# Patient Record
Sex: Female | Born: 1980 | Race: Black or African American | Hispanic: No | Marital: Single | Smoking: Former smoker
Health system: Southern US, Community
[De-identification: ages and names within clinical notes are randomized; demographics above are authoritative.]

## PROBLEM LIST (undated history)

## (undated) DIAGNOSIS — I10 Essential (primary) hypertension: Secondary | ICD-10-CM

## (undated) HISTORY — PX: FEMUR SURGERY: SHX943

## (undated) HISTORY — PX: FRACTURE SURGERY: SHX138

---

## 2010-05-17 ENCOUNTER — Emergency Department (HOSPITAL_COMMUNITY): Admission: EM | Admit: 2010-05-17 | Discharge: 2010-05-17 | Payer: Self-pay | Admitting: Emergency Medicine

## 2011-10-15 ENCOUNTER — Emergency Department (HOSPITAL_COMMUNITY)
Admission: EM | Admit: 2011-10-15 | Discharge: 2011-10-15 | Disposition: A | Discharge status: Home / Self Care | Payer: BC Managed Care – PPO | Attending: Emergency Medicine | Admitting: Emergency Medicine

## 2011-10-15 ENCOUNTER — Encounter: Payer: Self-pay | Admitting: *Deleted

## 2011-10-15 DIAGNOSIS — F17200 Nicotine dependence, unspecified, uncomplicated: Secondary | ICD-10-CM | POA: Insufficient documentation

## 2011-10-15 DIAGNOSIS — N751 Abscess of Bartholin's gland: Secondary | ICD-10-CM | POA: Insufficient documentation

## 2011-10-15 MED ORDER — HYDROCODONE-ACETAMINOPHEN 5-500 MG PO TABS: 1.0000 | ORAL_TABLET | Freq: Four times a day (QID) | ORAL | Status: AC | PRN

## 2011-10-15 MED ORDER — LIDOCAINE-EPINEPHRINE 2 %-1:100000 IJ SOLN
20.0000 mL | Freq: Once | INTRAMUSCULAR | Status: AC
  Administered 2011-10-15: 1 mL
  Filled 2011-10-15: qty 1

## 2011-10-15 MED ORDER — OXYCODONE-ACETAMINOPHEN 5-325 MG PO TABS
2.0000 | ORAL_TABLET | Freq: Once | ORAL | Status: AC
  Administered 2011-10-15: 2 via ORAL
  Filled 2011-10-15: qty 2

## 2011-10-15 NOTE — ED Notes (Signed)
Pt states she has knot on her labia. Pt states it has become painful for her to walk or wear jeans. Pt denies any discharge or odor

## 2011-10-15 NOTE — ED Provider Notes (Signed)
History     CSN: [id number] Arrival date & time: 10/15/2011  9:46 AM   First MD Initiated Contact with Patient 10/15/11 1007      Chief Complaint  Patient presents with  . Vaginal Pain    (Consider location/radiation/quality/duration/timing/severity/associated sxs/prior treatment) HPI  Patient presents to emergency department with a few day history of left labial swelling with increasing pain with the increasing labial swelling. Patient denies history of similar events. Patient has taken over-the-counter pain medicine without relief of pain. Patient states pain is aggravated by walking, sitting, and direct pressure to labial region. Patient denies vaginal discharge, fevers, chills, abdominal pain, dysuria, hematuria. Patient states she does have an OB/GYN in the [facility]. Symptoms are gradual onset, persistent, and worsening.  History reviewed. No pertinent past medical history.  Past Surgical History  Procedure Date  . Femur surgery     History reviewed. No pertinent family history.  History  Substance Use Topics  . Smoking status: Current Everyday Smoker  . Smokeless tobacco: Not on file  . Alcohol Use: No    OB History    Grav Para Term Preterm Abortions TAB SAB Ect Mult Living                  Review of Systems  All other systems reviewed and are negative.    Allergies  Review of patient's allergies indicates no known allergies.  Home Medications  No current outpatient prescriptions on file.  BP 114/83  Pulse 94  Temp(Src) 98.7 F (37.1 C) (Oral)  Resp 16  Ht 5\' 7"  (1.702 m)  Wt 115 lb (52.164 kg)  BMI 18.01 kg/m2  SpO2 100%  LMP 09/27/2011  Physical Exam  Nursing note and vitals reviewed. Constitutional: She is oriented to person, place, and time. She appears well-developed and well-nourished.  HENT:  Head: Normocephalic and atraumatic.  Eyes: Conjunctivae are normal.  Neck: Normal range of motion. Neck supple.  Cardiovascular: Normal rate.   Exam reveals no gallop and no friction rub.   No murmur heard. Pulmonary/Chest: Effort normal and breath sounds normal. No respiratory distress. She has no wheezes. She has no rales. She exhibits no tenderness.  Abdominal: Soft. Bowel sounds are normal. She exhibits no distension and no mass. There is no tenderness. There is no rebound and no guarding.  Genitourinary: Vagina normal.    There is tenderness on the left labia.       2-3 cm round fluctuant swelling of left lower radial regions with tenderness to palpation consistent with swollen Bartholin's gland.  Musculoskeletal: Normal range of motion.  Neurological: She is alert and oriented to person, place, and time.  Skin: Skin is warm and dry.    ED Course  INCISION AND DRAINAGE Performed by: Jenness Corner Authorized by: Jenness Corner Consent: Verbal consent obtained. Risks and benefits: risks, benefits and alternatives were discussed Consent given by: patient Patient understanding: patient states understanding of the procedure being performed Patient consent: the patient's understanding of the procedure matches consent given Patient identity confirmed: verbally with patient and arm band Type: abscess Location: left lower labia/barholin's gland. Local anesthetic: lidocaine 2% with epinephrine Anesthetic total: 8 ml Scalpel size: 10 Needle gauge: 22 Incision type: single straight Complexity: complex Drainage: purulent Drainage amount: copious Wound treatment: ward's catheter placed. Patient tolerance: Patient tolerated the procedure well with no immediate complications.   (including critical care time)  By mouth Percocet  Labs Reviewed - No data to display No results found.  1. Bartholin's gland abscess       MDM  Patient tolerated incision and drainage of Bartholin gland well without complication or difficulty. Warts catheter placed. Abdomen soft nontender patient afebrile. Patient has close follow up with  her OB/GYN for recheck at the beginning of the week.        Jenness Corner, [place] 10/15/11 1530

## 2011-10-16 NOTE — ED Provider Notes (Signed)
Evaluation and management procedures were performed by the mid-level provider (PA/NP/CNM) under my supervision/collaboration. I was present and available during the ED course. Denise Cregan Y.   Gavin Potter. Oletta Lamas, MD 10/16/11 6213

## 2014-07-21 ENCOUNTER — Emergency Department (HOSPITAL_BASED_OUTPATIENT_CLINIC_OR_DEPARTMENT_OTHER)
Admission: EM | Admit: 2014-07-21 | Discharge: 2014-07-21 | Disposition: A | Discharge status: Home / Self Care | Payer: BC Managed Care – PPO | Attending: Emergency Medicine | Admitting: Emergency Medicine

## 2014-07-21 ENCOUNTER — Encounter (HOSPITAL_BASED_OUTPATIENT_CLINIC_OR_DEPARTMENT_OTHER): Payer: Self-pay | Admitting: Emergency Medicine

## 2014-07-21 DIAGNOSIS — Z3202 Encounter for pregnancy test, result negative: Secondary | ICD-10-CM | POA: Insufficient documentation

## 2014-07-21 DIAGNOSIS — F17200 Nicotine dependence, unspecified, uncomplicated: Secondary | ICD-10-CM | POA: Insufficient documentation

## 2014-07-21 DIAGNOSIS — K219 Gastro-esophageal reflux disease without esophagitis: Secondary | ICD-10-CM

## 2014-07-21 DIAGNOSIS — R1031 Right lower quadrant pain: Secondary | ICD-10-CM | POA: Insufficient documentation

## 2014-07-21 DIAGNOSIS — N949 Unspecified condition associated with female genital organs and menstrual cycle: Secondary | ICD-10-CM | POA: Insufficient documentation

## 2014-07-21 LAB — CBC WITH DIFFERENTIAL/PLATELET
BASOS ABS: 0 10*3/uL (ref 0.0–0.1)
Basophils Relative: 0 % (ref 0–1)
Eosinophils Absolute: 0.1 10*3/uL (ref 0.0–0.7)
Eosinophils Relative: 2 % (ref 0–5)
HCT: 36 % (ref 36.0–46.0)
Hemoglobin: 11.8 g/dL — ABNORMAL LOW (ref 12.0–15.0)
LYMPHS PCT: 28 % (ref 12–46)
Lymphs Abs: 2.2 10*3/uL (ref 0.7–4.0)
MCH: 32.7 pg (ref 26.0–34.0)
MCHC: 32.8 g/dL (ref 30.0–36.0)
MCV: 99.7 fL (ref 78.0–100.0)
Monocytes Absolute: 0.8 10*3/uL (ref 0.1–1.0)
Monocytes Relative: 10 % (ref 3–12)
NEUTROS ABS: 4.7 10*3/uL (ref 1.7–7.7)
Neutrophils Relative %: 60 % (ref 43–77)
PLATELETS: 208 10*3/uL (ref 150–400)
RBC: 3.61 MIL/uL — ABNORMAL LOW (ref 3.87–5.11)
RDW: 11.5 % (ref 11.5–15.5)
WBC: 7.8 10*3/uL (ref 4.0–10.5)

## 2014-07-21 LAB — URINALYSIS, ROUTINE W REFLEX MICROSCOPIC
Bilirubin Urine: NEGATIVE
Glucose, UA: NEGATIVE mg/dL
HGB URINE DIPSTICK: NEGATIVE
Ketones, ur: NEGATIVE mg/dL
Leukocytes, UA: NEGATIVE
NITRITE: NEGATIVE
PH: 7.5 (ref 5.0–8.0)
Protein, ur: NEGATIVE mg/dL
SPECIFIC GRAVITY, URINE: 1.024 (ref 1.005–1.030)
UROBILINOGEN UA: 1 mg/dL (ref 0.0–1.0)

## 2014-07-21 LAB — COMPREHENSIVE METABOLIC PANEL
ALT: 6 U/L (ref 0–35)
AST: 17 U/L (ref 0–37)
Albumin: 3.8 g/dL (ref 3.5–5.2)
Alkaline Phosphatase: 35 U/L — ABNORMAL LOW (ref 39–117)
Anion gap: 13 (ref 5–15)
BUN: 14 mg/dL (ref 6–23)
CHLORIDE: 103 meq/L (ref 96–112)
CO2: 24 meq/L (ref 19–32)
Calcium: 9.2 mg/dL (ref 8.4–10.5)
Creatinine, Ser: 0.6 mg/dL (ref 0.50–1.10)
GFR calc Af Amer: >90 mL/min (ref >90)
Glucose, Bld: 92 mg/dL (ref 70–99)
POTASSIUM: 3.9 meq/L (ref 3.7–5.3)
SODIUM: 140 meq/L (ref 137–147)
Total Protein: 6.9 g/dL (ref 6.0–8.3)

## 2014-07-21 LAB — PREGNANCY, URINE: PREG TEST UR: NEGATIVE

## 2014-07-21 LAB — LIPASE, BLOOD: Lipase: 58 U/L (ref 11–59)

## 2014-07-21 MED ORDER — GI COCKTAIL ~~LOC~~
30.0000 mL | Freq: Once | ORAL | Status: AC
  Administered 2014-07-21: 30 mL via ORAL
  Filled 2014-07-21: qty 30

## 2014-07-21 MED ORDER — ONDANSETRON 8 MG PO TBDP
8.0000 mg | ORAL_TABLET | Freq: Once | ORAL | Status: AC
  Administered 2014-07-21: 8 mg via ORAL
  Filled 2014-07-21: qty 1

## 2014-07-21 NOTE — Discharge Instructions (Signed)
Take Pepci AC, Zanta,, or Prilosec OTC (store brands work just as well as name brands) for 4-8 weeks.  Gastroesophageal Reflux Disease, Adult Gastroesophageal reflux disease (GERD) happens when acid from your stomach flows up into the esophagus. When acid comes in contact with the esophagus, the acid causes soreness (inflammation) in the esophagus. Over time, GERD may create small holes (ulcers) in the lining of the esophagus. CAUSES   Increased body weight. This puts pressure on the stomach, making acid rise from the stomach into the esophagus.  Smoking. This increases acid production in the stomach.  Drinking alcohol. This causes decreased pressure in the lower esophageal sphincter (valve or ring of muscle between the esophagus and stomach), allowing acid from the stomach into the esophagus.  Late evening meals and a full stomach. This increases pressure and acid production in the stomach.  A malformed lower esophageal sphincter. Sometimes, no cause is found. SYMPTOMS   Burning pain in the lower part of the mid-chest behind the breastbone and in the mid-stomach area. This may occur twice a week or more often.  Trouble swallowing.  Sore throat.  Dry cough.  Asthma-like symptoms including chest tightness, shortness of breath, or wheezing. DIAGNOSIS  Your caregiver may be able to diagnose GERD based on your symptoms. In some cases, X-rays and other tests may be done to check for complications or to check the condition of your stomach and esophagus. TREATMENT  Your caregiver may recommend over-the-counter or prescription medicines to help decrease acid production. Ask your caregiver before starting or adding any new medicines.  HOME CARE INSTRUCTIONS   Change the factors that you can control. Ask your caregiver for guidance concerning weight loss, quitting smoking, and alcohol consumption.  Avoid foods and drinks that make your symptoms worse, such as:  Caffeine or alcoholic  drinks.  Chocolate.  Peppermint or mint flavorings.  Garlic and onions.  Spicy foods.  Citrus fruits, such as oranges, lemons, or limes.  Tomato-based foods such as sauce, chili, salsa, and pizza.  Fried and fatty foods.  Avoid lying down for the 3 hours prior to your bedtime or prior to taking a nap.  Eat small, frequent meals instead of large meals.  Wear loose-fitting clothing. Do not wear anything tight around your waist that causes pressure on your stomach.  Raise the head of your bed 6 to 8 inches with wood blocks to help you sleep. Extra pillows will not help.  Only take over-the-counter or prescription medicines for pain, discomfort, or fever as directed by your caregiver.  Do not take aspirin, ibuprofen, or other nonsteroidal anti-inflammatory drugs (NSAIDs). SEEK IMMEDIATE MEDICAL CARE IF:   You have pain in your arms, neck, jaw, teeth, or back.  Your pain increases or changes in intensity or duration.  You develop nausea, vomiting, or sweating (diaphoresis).  You develop shortness of breath, or you faint.  Your vomit is green, yellow, black, or looks like coffee grounds or blood.  Your stool is red, bloody, or black. These symptoms could be signs of other problems, such as heart disease, gastric bleeding, or esophageal bleeding. MAKE SURE YOU:   Understand these instructions.  Will watch your condition.  Will get help right away if you are not doing well or get worse. Document Released: 08/18/2005 Document Revised: 01/31/2012 Document Reviewed: 05/28/2011 [facility] Patient Information 2015 [place], [place]. This information is not intended to replace advice given to you by your health care provider. Make sure you discuss any questions you have with  your health care provider. ° °

## 2014-07-21 NOTE — ED Provider Notes (Signed)
CSN: [id number]     Arrival date & time 07/21/14  0136 History   First MD Initiated Contact with Patient 07/21/14 [phone]     Chief Complaint  Patient presents with  . Pelvic Pain     (Consider location/radiation/quality/duration/timing/severity/associated sxs/prior Treatment) Patient is a 33 y.o. female presenting with pelvic pain. The history is provided by the patient.  Pelvic Pain  She has been having intermittent right lower abdominal pain with radiation to the right flank. Pain is intermittent it has been going on for last 2 weeks. Pain will last about 10 minutes before resolving. She describes it as still in therapy and I rates her pain at 9/10 when present. When present, nothing seems to make it worse. It is better with massage. She's been taking ibuprofen with some temporary relief. There is no associated nausea or vomiting. She denies fever or chills. There has been no dysuria or urinary urgency or frequency. Last menses was one week ago and was lighter than normal. There's been no vaginal discharge or bleeding. She's not using any contraception.  History reviewed. No pertinent past medical history. Past Surgical History  Procedure Laterality Date  . Femur surgery     History reviewed. No pertinent family history. History  Substance Use Topics  . Smoking status: Current Every Day Smoker  . Smokeless tobacco: Not on file  . Alcohol Use: No   OB History   Grav Para Term Preterm Abortions TAB SAB Ect Mult Living                 Review of Systems  Genitourinary: Positive for pelvic pain.  All other systems reviewed and are negative.     Allergies  Review of patient's allergies indicates no known allergies.  Home Medications   Prior to Admission medications   Medication Sig Start Date End Date Taking? Authorizing Provider  HYDROcodone-acetaminophen (VICODIN) 5-500 MG per tablet Take 1 tablet by mouth every 6 (six) hours as needed.      Historical Provider, MD   ibuprofen (ADVIL,MOTRIN) 200 MG tablet Take 400 mg by mouth every 6 (six) hours as needed.      Historical Provider, MD   BP 171/123  Pulse 69  Temp(Src) 98 F (36.7 C) (Oral)  Resp 20  Ht  (1.702 m)  Wt 118 lb (53.524 kg)  BMI 18.48 kg/m2  SpO2 100%  LMP 07/14/2014 Physical Exam  Nursing note and vitals reviewed.  33 year old female, resting comfortably and in no acute distress. Vital signs are significant for hypertension. Oxygen saturation is 100%, which is normal. Head is normocephalic and atraumatic. PERRLA, EOMI. Oropharynx is clear. Neck is nontender and supple without adenopathy or JVD. Back is nontender and there is no CVA tenderness. Lungs are clear without rales, wheezes, or rhonchi. Chest is nontender. Heart has regular rate and rhythm without murmur. Abdomen is soft, flat, with mild epigastric tenderness. There is no rebound or guarding. There are no masses or hepatosplenomegaly and peristalsis is normoactive. Extremities have no cyanosis or edema, full range of motion is present. Skin is warm and dry without rash. Neurologic: Mental status is normal, cranial nerves are intact, there are no motor or sensory deficits.  ED Course  Procedures (including critical care time) Labs Review Results for orders placed during the hospital encounter of 07/21/14  URINALYSIS, ROUTINE W REFLEX MICROSCOPIC      Result Value Ref Range   Color, Urine YELLOW  YELLOW   APPearance CLOUDY (*) CLEAR  Specific Gravity, Urine 1.024  1.005 - 1.030   pH 7.5  5.0 - 8.0   Glucose, UA NEGATIVE  NEGATIVE mg/dL   Hgb urine dipstick NEGATIVE  NEGATIVE   Bilirubin Urine NEGATIVE  NEGATIVE   Ketones, ur NEGATIVE  NEGATIVE mg/dL   Protein, ur NEGATIVE  NEGATIVE mg/dL   Urobilinogen, UA 1.0  0.0 - 1.0 mg/dL   Nitrite NEGATIVE  NEGATIVE   Leukocytes, UA NEGATIVE  NEGATIVE  PREGNANCY, URINE      Result Value Ref Range   Preg Test, Ur NEGATIVE  NEGATIVE  CBC WITH DIFFERENTIAL      Result  Value Ref Range   WBC 7.8  4.0 - 10.5 K/uL   RBC 3.61 (*) 3.87 - 5.11 MIL/uL   Hemoglobin 11.8 (*) 12.0 - 15.0 g/dL   HCT 69.6  29.5 - 28.4 %   MCV 99.7  78.0 - 100.0 fL   MCH 32.7  26.0 - 34.0 pg   MCHC 32.8  30.0 - 36.0 g/dL   RDW 13.2  44.0 - 10.2 %   Platelets 208  150 - 400 K/uL   Neutrophils Relative % 60  43 - 77 %   Neutro Abs 4.7  1.7 - 7.7 K/uL   Lymphocytes Relative 28  12 - 46 %   Lymphs Abs 2.2  0.7 - 4.0 K/uL   Monocytes Relative 10  3 - 12 %   Monocytes Absolute 0.8  0.1 - 1.0 K/uL   Eosinophils Relative 2  0 - 5 %   Eosinophils Absolute 0.1  0.0 - 0.7 K/uL   Basophils Relative 0  0 - 1 %   Basophils Absolute 0.0  0.0 - 0.1 K/uL  COMPREHENSIVE METABOLIC PANEL      Result Value Ref Range   Sodium 140  137 - 147 mEq/L   Potassium 3.9  3.7 - 5.3 mEq/L   Chloride 103  96 - 112 mEq/L   CO2 24  19 - 32 mEq/L   Glucose, Bld 92  70 - 99 mg/dL   BUN 14  6 - 23 mg/dL   Creatinine, Ser 7.25  0.50 - 1.10 mg/dL   Calcium 9.2  8.4 - 36.6 mg/dL   Total Protein 6.9  6.0 - 8.3 g/dL   Albumin 3.8  3.5 - 5.2 g/dL   AST 17  0 - 37 U/L   ALT 6  0 - 35 U/L   Alkaline Phosphatase 35 (*) 39 - 117 U/L   Total Bilirubin <0.2 (*) 0.3 - 1.2 mg/dL   GFR calc non Af Amer >90  >90 mL/min   GFR calc Af Amer >90  >90 mL/min   Anion gap 13  5 - 15  LIPASE, BLOOD      Result Value Ref Range   Lipase 58  11 - 59 U/L    MDM   Final diagnoses:  Gastroesophageal reflux disease without esophagitis    Abdominal pain of uncertain cause. There is a disconnect with her complaint of pain being in the right lower abdomen and exam showing tenderness in the epigastric area. She will be given a therapeutic of a GI cocktail. Blood pressure will need to be repeated. Old records are reviewed and there are no relevant past visits.  She had excellent relief of symptoms with GI cocktail. Laboratory workup was unremarkable. Repeat blood pressure is still elevated and she is advised to have it rechecked as  an outpatient. Blood pressure is persistently elevated, she  may need to be started on medication to treat her blood pressure. She is advised to use over-the-counter H2 blockers or proton pump inhibitors for the next 4-8 weeks and then to use them as needed.  Dione Booze, MD 07/21/14 [phone]

## 2014-07-21 NOTE — ED Notes (Signed)
Patient reports that she is having right lower pelvic pain to flank pain x 2 weeks. The patient reports vomiting a couple of days ago. Denies any today. Patient is having loose stools. The patient reports that she is having a crampling feeling to here stomach.

## 2014-07-21 NOTE — ED Notes (Signed)
I took initial BP on right arm got 179/131, then changed to smaller cuff and got result of 179/123, so finally changed arms and used smaller cuff with results of 171/123, then notified nurse of same.

## 2014-11-04 ENCOUNTER — Encounter (HOSPITAL_BASED_OUTPATIENT_CLINIC_OR_DEPARTMENT_OTHER): Payer: Self-pay | Admitting: *Deleted

## 2014-11-04 ENCOUNTER — Emergency Department (HOSPITAL_BASED_OUTPATIENT_CLINIC_OR_DEPARTMENT_OTHER)
Admission: EM | Admit: 2014-11-04 | Discharge: 2014-11-04 | Disposition: A | Discharge status: Home / Self Care | Payer: BC Managed Care – PPO | Attending: Emergency Medicine | Admitting: Emergency Medicine

## 2014-11-04 DIAGNOSIS — Z79899 Other long term (current) drug therapy: Secondary | ICD-10-CM | POA: Insufficient documentation

## 2014-11-04 DIAGNOSIS — I10 Essential (primary) hypertension: Secondary | ICD-10-CM

## 2014-11-04 HISTORY — DX: Essential (primary) hypertension: I10

## 2014-11-04 MED ORDER — HYDROCHLOROTHIAZIDE 25 MG PO TABS: 25.0000 mg | ORAL_TABLET | Freq: Every day | ORAL | Status: DC

## 2014-11-04 NOTE — ED Provider Notes (Signed)
CSN: [id number]     Arrival date & time 11/04/14  1950 History  This chart was scribed for Doug SouSam Donasia Wimes, MD by Abel PrestoKara Demonbreun, ED Scribe. This patient was seen in room MH02/MH02 and the patient's care was started at 9:43 PM.    Chief Complaint  Patient presents with  . Hypertension    Patient is a 33 y.o. female presenting with hypertension. The history is provided by the patient. No language interpreter was used.  Hypertension Pertinent negatives include no chest pain and no shortness of breath.    HPI Comments: Denise Potter is a 33 y.o. female who presents to the Emergency Department complaining of hypertension. 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  Pt notes fatigue and intermittent pressure in her neck with onset a month ago. None presently. Patient presently asymptomatic Pt notes getting upset or angry makes the pressure worse. Pt states she has has another instance of elevated BP during a prior unrelated ED visit. She notes she was not prescribed medication for HTN at that time.  Pt smoked until two months ago, with EtOH use until 3 months ago, and denies drug abuse. Pt is currently on her period and denies chance of pregnancy. Pt denies dyspnea and chest pain.   Past Medical History  Diagnosis Date  . Hypertension    Past Surgical History  Procedure Laterality Date  . Femur surgery     History reviewed. No pertinent family history. History  Substance Use Topics  . Smoking status: Former Games developermoker  . Smokeless tobacco: Not on file  . Alcohol Use: No   OB History    No data available     Review of Systems  Constitutional: Negative.   HENT: Negative.   Respiratory: Negative.  Negative for shortness of breath.   Cardiovascular: Negative.  Negative for chest pain.  Gastrointestinal: Negative.   Musculoskeletal: Negative.   Skin: Negative.   Neurological: Negative.   Psychiatric/Behavioral: Negative.   All other systems reviewed  and are negative.     Allergies  Review of patient's allergies indicates no known allergies.  Home Medications   Prior to Admission medications   Medication Sig Start Date End Date Taking? Authorizing Provider  HYDROcodone-acetaminophen (VICODIN) 5-500 MG per tablet Take 1 tablet by mouth every 6 (six) hours as needed.      Historical Provider, MD  ibuprofen (ADVIL,MOTRIN) 200 MG tablet Take 400 mg by mouth every 6 (six) hours as needed.      Historical Provider, MD   BP 158/117 mmHg  Pulse 64  Temp(Src) 98.4 F (36.9 C) (Oral)  Resp 20  Ht 5\' 7"  (1.702 m)  Wt 120 lb (54.432 kg)  BMI 18.79 kg/m2  SpO2 100%  LMP 11/04/2014 Physical Exam  Constitutional: She appears well-developed and well-nourished.  HENT:  Head: Normocephalic and atraumatic.  Eyes: Conjunctivae are normal. Pupils are equal, round, and reactive to light.  Neck: Neck supple. No tracheal deviation present. No thyromegaly present.  Cardiovascular: Normal rate and regular rhythm.   No murmur heard. Pulmonary/Chest: Effort normal and breath sounds normal.  Abdominal: Soft. Bowel sounds are normal. She exhibits no distension. There is no tenderness.  Musculoskeletal: Normal range of motion. She exhibits no edema or tenderness.  Neurological: She is alert. Coordination normal.  Skin: Skin is warm and dry. No rash noted.  Psychiatric: She has a normal mood and affect.  Nursing note and vitals reviewed.   ED Course  Procedures (  including critical care time) DIAGNOSTIC STUDIES: Oxygen Saturation is 100% on room air, normal by my interpretation.    COORDINATION OF CARE: 9:43 PM Discussed treatment plan with patient at beside, the patient agrees with the plan and has no further questions at this time.   Labs Review Labs Reviewed - No data to display  Imaging Review No results found.   EKG Interpretation None      MDM  Patient had normal renal function August 2015. No further diagnostic testing needed.  Plan prescription HCTZ. Referral resource guide. Blood pressure recheck 1 week Final diagnoses:  None   Diagnosis hypertension  I personally performed the services described in this documentation, which was scribed in my presence. The recorded information has been reviewed and considered.     Doug SouSam Shreyansh Tiffany, MD 11/04/14 2158

## 2014-11-04 NOTE — ED Notes (Addendum)
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

## 2014-11-04 NOTE — Discharge Instructions (Signed)
Hypertension Start taking the medication prescribed tomorrow morning. Call any of the numbers on the resource guide to get a primary care physician. Get your blood pressure rechecked in one week by your new primary care physician or at an urgent care center Hypertension, commonly called high blood pressure, is when the force of blood pumping through your arteries is too strong. Your arteries are the blood vessels that carry blood from your heart throughout your body. A blood pressure reading consists of a higher number over a lower number, such as 110/72. The higher number (systolic) is the pressure inside your arteries when your heart pumps. The lower number (diastolic) is the pressure inside your arteries when your heart relaxes. Ideally you want your blood pressure below 120/80. Hypertension forces your heart to work harder to pump blood. Your arteries may become narrow or stiff. Having hypertension puts you at risk for heart disease, stroke, and other problems.  RISK FACTORS Some risk factors for high blood pressure are controllable. Others are not.  Risk factors you cannot control include:   Race. You may be at higher risk if you are African American.  Age. Risk increases with age.  Gender. Men are at higher risk than women before age 33 years. After age 33, women are at higher risk than men. Risk factors you can control include:  Not getting enough exercise or physical activity.  Being overweight.  Getting too much fat, sugar, calories, or salt in your diet.  Drinking too much alcohol. SIGNS AND SYMPTOMS Hypertension does not usually cause signs or symptoms. Extremely high blood pressure (hypertensive crisis) may cause headache, anxiety, shortness of breath, and nosebleed. DIAGNOSIS  To check if you have hypertension, your health care provider will measure your blood pressure while you are seated, with your arm held at the level of your heart. It should be measured at least twice using  the same arm. Certain conditions can cause a difference in blood pressure between your right and left arms. A blood pressure reading that is higher than normal on one occasion does not mean that you need treatment. If one blood pressure reading is high, ask your health care provider about having it checked again. TREATMENT  Treating high blood pressure includes making lifestyle changes and possibly taking medicine. Living a healthy lifestyle can help lower high blood pressure. You may need to change some of your habits. Lifestyle changes may include:  Following the DASH diet. This diet is high in fruits, vegetables, and whole grains. It is low in salt, red meat, and added sugars.  Getting at least 2 hours of brisk physical activity every week.  Losing weight if necessary.  Not smoking.  Limiting alcoholic beverages.  Learning ways to reduce stress. If lifestyle changes are not enough to get your blood pressure under control, your health care provider may prescribe medicine. You may need to take more than one. Work closely with your health care provider to understand the risks and benefits. HOME CARE INSTRUCTIONS  Have your blood pressure rechecked as directed by your health care provider.   Take medicines only as directed by your health care provider. Follow the directions carefully. Blood pressure medicines must be taken as prescribed. The medicine does not work as well when you skip doses. Skipping doses also puts you at risk for problems.   Do not smoke.   Monitor your blood pressure at home as directed by your health care provider. SEEK MEDICAL CARE IF:   You think you  are having a reaction to medicines taken.  You have recurrent headaches or feel dizzy.  You have swelling in your ankles.  You have trouble with your vision. SEEK IMMEDIATE MEDICAL CARE IF:  You develop a severe headache or confusion.  You have unusual weakness, numbness, or feel faint.  You have  severe chest or abdominal pain.  You vomit repeatedly.  You have trouble breathing. MAKE SURE YOU:   Understand these instructions.  Will watch your condition.  Will get help right away if you are not doing well or get worse. Document Released: 11/08/2005 Document Revised: 03/25/2014 Document Reviewed: 08/31/2013 [facility] Patient Information 2015 [place], [place]. This information is not intended to replace advice given to you by your health care provider. Make sure you discuss any questions you have with your health care provider.  Emergency Department Resource Guide 1) Find a Doctor and Pay Out of Pocket Although you won't have to find out who is covered by your insurance plan, it is a good idea to ask around and get recommendations. You will then need to call the office and see if the doctor you have chosen will accept you as a new patient and what types of options they offer for patients who are self-pay. Some doctors offer discounts or will set up payment plans for their patients who do not have insurance, but you will need to ask so you aren't surprised when you get to your appointment.  2) Contact Your Local Health Department Not all health departments have doctors that can see patients for sick visits, but many do, so it is worth a call to see if yours does. If you don't know where your local health department is, you can check in your phone book. The CDC also has a tool to help you locate your state's health department, and many state websites also have listings of all of their local health departments.  3) Find a Walk-in Clinic If your illness is not likely to be very severe or complicated, you may want to try a walk in clinic. These are popping up all over the country in pharmacies, drugstores, and shopping centers. They're usually staffed by nurse practitioners or physician assistants that have been trained to treat common illnesses and complaints. They're usually fairly quick and  inexpensive. However, if you have serious medical issues or chronic medical problems, these are probably not your best option.  No Primary Care Doctor: - Call Health Connect at  [phone] - they can help you locate a primary care doctor that  accepts your insurance, provides certain services, etc. - Physician Referral Service- [phone]  Chronic Pain Problems: Organization         Address  Phone   Notes  [facility]  [phone] Patients need to be referred by their primary care doctor.   Medication Assistance: Organization         Address  Phone   Notes  [facility] Medication [facility] [address] [place] [place], [place] [address] [phone] --Must be a resident of [facility] -- Must have NO insurance coverage whatsoever (no Medicaid/ Medicare, etc.) -- The pt. MUST have a primary care doctor that directs their care regularly and follows them in the community   [facility]ist  [phone]   [employer]  [phone]    Agencies that provide inexpensive medical care: Organization         Address  Phone   Notes  Patrcia Dolly  [facility] Family [facility]  [phone]   [facility]    [phone]   [facility] [address] [place], [place] [address] [phone]   [facility] [address] [place] [place] [phone]   Planned Parenthood    [phone]   [facility]    [phone]   [facility]  [address], [place] Phone:  [phone], Fax:  [phone] Hours of Operation:  9 am - 6 pm, M-F.  Also accepts Medicaid/Medicare and self-pay.  [facility] for Children  [address], [place] Phone: [phone], Fax: [phone]. Hours of Operation:  8:30 am - 5:30 pm, M-F.  Also accepts Medicaid and self-pay.  [facility] [place] [address] [place] Phone: [phone]   [facility] [address] Natasha Bence [place], [place] [phone], Ext. [phone] Mondays & Thursdays: 7-9 AM.  First 15 patients are seen on a first come, first serve basis.    Medicaid-accepting [facility] Providers:  Organization         Address  Phone   Notes  [facility] [address],  [phone] Also accepts self-pay patients.  [facility] [address] Laurell Josephs [place], [place]  [phone]   [facility] [address], [place] [phone]   [facility] [address] [place] [phone]   Renaye Rakers [address], [place]   [phone] Only accepts [place] patients after they have their name applied to their card.   Self-Pay (no insurance) in [facility]:  Organization         Address  Phone   Notes  Sickle Cell Patients, [facility] [address] [place], [place] [phone]   [facility] Urgent Care [address] [place], [place] [phone]   [facility] [place]  [address], [place] [phone]   [facility]/Dr. Osei-Bonsu  [address] [place] or [address], [place] [phone] Phone number for both [place] and [place] locations is the same.  [facility] and [facility] [address], [place] [phone]   [facility] [address], [place] or [address] [phone]   [facility] [address], [place] [phone], phone; [phone], fax Sees patients 1st and 3rd Saturday of every month.  Must not qualify for public or private insurance (i.e. Medicaid, Medicare, [place] Health Choice, Veterans' Benefits)  Household income should be no more than 200% of the poverty level The clinic cannot treat you if you are pregnant or  think you are pregnant  Sexually transmitted diseases are not treated at the clinic.    Dental Care: Organization         Address  Phone  Notes  [facility] Department of [facility] [address] [place], [place] [phone] Accepts children up to age 48 who are enrolled in [place] or [place] Health Choice; pregnant women with a Medicaid card; and children who have applied for Medicaid or  Health Choice, but were declined, whose parents can pay a reduced fee at time of service.  [facility] Department of [employer]  [facility] [place]  [address], [place] [phone] Accepts children up to age 68 who are enrolled in Medicaid or [place] Health Choice; pregnant women with a Medicaid card; and children who have applied for Medicaid or [place] Health Choice, but were declined, whose parents can pay a reduced fee at time of service.  [facility]  [address] [place], [place] [phone] Patients are seen by appointment only. Walk-ins are not accepted. [facility] will see patients 74 years of age and older. Monday - Tuesday (8am-5pm) Most Wednesdays (8:30-5pm) $30 per visit, cash only  [facility] Adult [facility] Access [facility]  [address], [facility] [phone] Patients are seen by appointment only. Walk-ins are not accepted. [facility] will see patients 24 years of age and older. One Wednesday Evening (Monthly: Volunteer Based).  $30 per visit, cash only  [employer]  [phone] for adults; Children under age 71, call Graduate Pediatric Dentistry at [phone]. Children aged 44-14, please call [phone] to request a pediatric application.  Dental services are provided in all areas of dental care including fillings, crowns and bridges, complete and partial dentures, implants, gum treatment, root canals, and extractions. Preventive care is also provided. Treatment is provided to both adults  and children. Patients are selected via a lottery and there is often a waiting list.   [facility] [address] [place]  [phone] [email or web address]   [facility] [address] [place], [place] [phone], Ext. [phone] Second and Fourth Thursday of each month, opens at 6:30 AM; Clinic ends at 9 AM.  Patients are seen on a first-come first-served basis, and a limited number are seen during each clinic.   [facility]  [address] Ether Griffins [place], [place] [phone]   Eligibility Requirements You must have lived in [place], [place], or [place] counties for at least the last three months.   You cannot be eligible for state or federal sponsored [place], including [employer], [place], or [employer].   You generally cannot be eligible for healthcare insurance through your employer.    How to apply: Eligibility screenings are held every Tuesday and Wednesday afternoon from 1:00 pm until 4:00 pm. You do not need an appointment for the interview!  [facility] [address] [place], [place] [phone]   [facility] Health Department  [phone]   [facility] Health Department  [phone]   [facility] Health Department  [phone]    Behavioral Health Resources in the Community: Intensive Outpatient Programs Organization         Address  Phone  Notes  [facility] [address] [place], [place] [phone]   [facility] Outpatient [address], [place], [place] [phone]   [facility]: [facility] [address], [place], [place]  [phone]   [facility] Mental Health [address]  [place], [place] [phone] or [phone]   Substance Abuse Resources Organization         Address  Phone  Notes  Al[facility] Drug Services  [phone]   [facility]  [phone]   [facility] [place]  [phone]     Floydene Flock  [phone]   Residential & Outpatient Substance Abuse Program  [phone]   Psychological Services Organization         Address  Phone  Notes  [facility] Behavioral Health  [phone]   [facility]  336- [phone]   [facility] [address], [place] [phone] or [phone]    Mobile Crisis Teams Organization         Address  Phone  Notes  [facility], Mobile Crisis Care Unit  [phone]   [facility]  [address] [place], [place] [phone]   Doristine LocksSharon DeEsch [address] [place] [phone]    Self-Help/Support Groups Organization         Address  Phone             Notes  [facility] [place] - variety of support groups  336- [phone] Call for more information  Narcotics Anonymous (NA), [facility] [address], [place]  2 meetings at this location   Statisticianesidential Treatment Programs Organization         Address  Phone  Notes  [facility] [address],    [place]  [phone]   [facility]  [address], [place] [phone], [place], [place] [phone]   [facility] [address], [place] [phone] Admissions: 8am-3pm M-F  [facility] [address]    [place], [place] [phone]   [facility] [address], [place], [place] [phone]   [facility] [address]  [place], [place] [phone]   [facility] - Intensive Outpatient [address], [place], [place] [phone]   [facility] ([facility]iction Recovery [facility] [address]  [place], [place] [phone] or [phone]   [facility] ([facility]) [address] [place], [place] [phone] Accepts Medicaid  [facility] [address]  [place] [phone] Substance Abuse/Addiction Treatment   [facility] [place]  Behavioral Health Resources Organization         Address  Phone  Notes  [facility]  [phone](888) [phone]   Angie FavaJulie Brannon, PhD [address] [place], [place]   [phone] or [phone]) [phone]   [facility]   [address] [place], [place] [phone](336) [phone]   [facility] [address], [place], [place] [phone]) [phone] Insurance/Medicaid/sponsorship through [facility]  [facility] Families [address]                                    [place], [place] [phone]) [phone] Therapy/tele-psych/case  [facility] [address]   [place], [place] [phone]) [phone]    Dr. Lolly MustacheArfeen  [phone]) [phone]   [facility]  [employer] [place] [facility] 1) [address], [place] 2) [address], [place] 3)  [address], [place] [phone]) [phone]) [phone]  [phone]) [phone]   [facility] Child Abuse Hotline [phone](336) [phone] or [phone](336) [phone] (After Hours)

## 2014-11-05 ENCOUNTER — Telehealth: Payer: Self-pay | Admitting: Physician Assistant

## 2014-11-05 ENCOUNTER — Ambulatory Visit (INDEPENDENT_AMBULATORY_CARE_PROVIDER_SITE_OTHER): Payer: Self-pay | Admitting: Physician Assistant

## 2014-11-05 VITALS — BP 184/116 | HR 62 | Temp 98.2°F | Resp 18 | Ht 67.0 in | Wt 124.2 lb

## 2014-11-05 DIAGNOSIS — Z13220 Encounter for screening for lipoid disorders: Secondary | ICD-10-CM

## 2014-11-05 DIAGNOSIS — I10 Essential (primary) hypertension: Secondary | ICD-10-CM | POA: Insufficient documentation

## 2014-11-05 LAB — LIPID PANEL
CHOL/HDL RATIO: 1.9 ratio
CHOLESTEROL: 155 mg/dL (ref 0–200)
HDL: 82 mg/dL (ref >39)
LDL Cholesterol: 64 mg/dL (ref 0–99)
Triglycerides: 46 mg/dL (ref <150)
VLDL: 9 mg/dL (ref 0–40)

## 2014-11-05 MED ORDER — AMLODIPINE BESYLATE 10 MG PO TABS: 10.0000 mg | ORAL_TABLET | Freq: Every day | ORAL | Status: DC

## 2014-11-05 NOTE — Patient Instructions (Signed)
Buy a monitor and take BP at home daily. BP should be < 140/90 Take hydrochlorithiazide and amlodipine daily. Return in 1 week for BP follow up.

## 2014-11-05 NOTE — Telephone Encounter (Signed)
Work note waiting for her for pick up.

## 2014-11-05 NOTE — Progress Notes (Signed)
The patient was discussed with me and I agree with the diagnosis and treatment plan.  

## 2014-11-05 NOTE — Progress Notes (Signed)
Subjective:    Patient ID: Denise Potter, female    DOB: 12/15/1980, 33 y.o.   MRN: [id number]  HPI  This is a 33 year old female presenting for evaluation of HTN. She reports she had to get an employment physical for her job yesterday. 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 She was told at her employment physical that her blood pressure was elevated to "stroke level". They did not prescribe her anything. Pt reports she started panicking and went to the ED. At the ED her BP was 117/110. She has been told before that her BP was elevated when she went to the ED 4 months ago - it was 174/123 at that time. She was prescribed HCTZ 25 mg last night. She took 1 pill this morning. Today her BP is 186/116. High BP runs in her family. She denies headache, blurred vision, palpitations, or chest pain.   Review of Systems  Constitutional: Negative.   Eyes: Negative for visual disturbance.  Cardiovascular: Negative for chest pain, palpitations and leg swelling.  Gastrointestinal: Negative for nausea, vomiting and diarrhea.  Skin: Negative for rash.  Neurological: Negative for dizziness, numbness and headaches.   Patient Active Problem List   Diagnosis Date Noted  . Essential hypertension 11/05/2014   Prior to Admission medications   Medication Sig Start Date End Date Taking? Authorizing Provider  hydrochlorothiazide (HYDRODIURIL) 25 MG tablet Take 1 tablet (25 mg total) by mouth daily. 11/04/14  Yes Doug SouSam Jacubowitz, MD   No Known Allergies  Patient's social and family history were reviewed.     Objective:   Physical Exam  Constitutional: She is oriented to person, place, and time. She appears well-developed and well-nourished. No distress.  HENT:  Head: Normocephalic and atraumatic.  Right Ear: Hearing normal.  Left Ear: Hearing normal.  Nose: Nose normal.  Mouth/Throat: Uvula is midline, oropharynx is clear and moist and mucous membranes are normal.  Eyes: Conjunctivae  and lids are normal. Right eye exhibits no discharge. Left eye exhibits no discharge. No scleral icterus.  Neck: Trachea normal.  Cardiovascular: Normal rate, regular rhythm, normal heart sounds, intact distal pulses and normal pulses.   No murmur heard. Pulmonary/Chest: Effort normal and breath sounds normal. No respiratory distress. She has no wheezes. She has no rhonchi. She has no rales.  Musculoskeletal: Normal range of motion.  Neurological: She is alert and oriented to person, place, and time. No cranial nerve deficit.  Skin: Skin is warm, dry and intact. No lesion and no rash noted.  Psychiatric: She has a normal mood and affect. Her speech is normal and behavior is normal. Thought content normal.  BP 184/116 mmHg  Pulse 62  Temp(Src) 98.2 F (36.8 C) (Oral)  Resp 18  Ht 5\' 7"  (1.702 m)  Wt 124 lb 3.2 oz (56.337 kg)  BMI 19.45 kg/m2  SpO2 100%  LMP 11/04/2014  EKG: NSR    Assessment & Plan:  1. Essential hypertension 2. Need for lipid screening Pt will continue with HCTZ 25 mg and will add amlodipine 10 mg. She will purchase a monitor and check BP daily. Letter provided saying she may continue to work (job is not strenuous). EKG normal today. Lipid panel and TSH pending. She will return in 1 week for BP recheck.   - Lipid panel - TSH - amLODipine (NORVASC) 10 MG tablet; Take 1 tablet (10 mg total) by mouth daily.  Dispense: 90 tablet; Refill: 3 - EKG 12-Lead  [place] MinersNicole V. Dyke BrackettBush, PA-C, MHS [facility]  11/05/2014

## 2014-11-05 NOTE — Telephone Encounter (Signed)
Patient is requesting that her work note be changed from "may lift 5 pounds" to "no weight at all". Patient needs note as soon as possible. Her job will not let her come back until this is changed.   [phone]

## 2014-11-06 ENCOUNTER — Telehealth: Payer: Self-pay

## 2014-11-06 LAB — TSH: TSH: 0.763 u[IU]/mL (ref 0.350–4.500)

## 2014-11-06 NOTE — Telephone Encounter (Signed)
Revision of previous note, she needs the work note to say," No standing restrictions and no standing restrictions."

## 2014-11-06 NOTE — Telephone Encounter (Signed)
I called and spoke to pt. I told her with her BP being as high as it was in the office that I cannot write the note for her to be lifting over 5lbs. I told her once she re-checks she can get a new work note. She wanted to speak to Joni Reiningicole, who is out of the office today. She said she was going to call back.

## 2014-11-06 NOTE — Telephone Encounter (Signed)
Patient states her employer will not allow to return to work with the work note our office gave her. Patient is requesting a new work note to say she is able to stand on her feet for several hours and she is able to lift over 5lbs. Please call patient when note is ready to be picked up at [phone]

## 2014-11-06 NOTE — Telephone Encounter (Signed)
Pt called again regarding this return to work note. States her employer is needing this. Please call and advise.

## 2014-11-08 ENCOUNTER — Telehealth: Payer: Self-pay | Admitting: Physician Assistant

## 2014-11-08 NOTE — Telephone Encounter (Signed)
Talked with pt. She has been taking her BP daily at home and ranging 120s/80s. She needs a new letter to her employer saying she can return to work without standing/lifting restrictions. She will be returning on 11/12/14 for follow up. Letter waiting for her.

## 2014-11-12 ENCOUNTER — Ambulatory Visit (INDEPENDENT_AMBULATORY_CARE_PROVIDER_SITE_OTHER): Payer: Self-pay | Admitting: Physician Assistant

## 2014-11-12 VITALS — BP 128/90 | HR 88 | Temp 97.8°F | Resp 18 | Ht 66.0 in | Wt 117.4 lb

## 2014-11-12 DIAGNOSIS — I10 Essential (primary) hypertension: Secondary | ICD-10-CM

## 2014-11-12 MED ORDER — HYDROCHLOROTHIAZIDE 25 MG PO TABS: 25.0000 mg | ORAL_TABLET | Freq: Every day | ORAL | Status: DC

## 2014-11-12 NOTE — Patient Instructions (Signed)
Continue taking blood pressure daily and record numbers. Decrease salt in diet, limit your intake of processed foods. Return in 1 month for follow up and pap smear.  DASH Eating Plan DASH stands for "Dietary Approaches to Stop Hypertension." The DASH eating plan is a healthy eating plan that has been shown to reduce high blood pressure (hypertension). Additional health benefits may include reducing the risk of type 2 diabetes mellitus, heart disease, and stroke. The DASH eating plan may also help with weight loss. WHAT DO I NEED TO KNOW ABOUT THE DASH EATING PLAN? For the DASH eating plan, you will follow these general guidelines:  Choose foods with a percent daily value for sodium of less than 5% (as listed on the food label).  Use salt-free seasonings or herbs instead of table salt or sea salt.  Check with your health care provider or pharmacist before using salt substitutes.  Eat lower-sodium products, often labeled as "lower sodium" or "no salt added."  Eat fresh foods.  Eat more vegetables, fruits, and low-fat dairy products.  Choose whole grains. Look for the word "whole" as the first word in the ingredient list.  Choose fish and skinless chicken or [place] more often than red meat. Limit fish, poultry, and meat to 6 oz (170 g) each day.  Limit sweets, desserts, sugars, and sugary drinks.  Choose heart-healthy fats.  Limit cheese to 1 oz (28 g) per day.  Eat more home-cooked food and less restaurant, buffet, and fast food.  Limit fried foods.  Cook foods using methods other than frying.  Limit canned vegetables. If you do use them, rinse them well to decrease the sodium.  When eating at a restaurant, ask that your food be prepared with less salt, or no salt if possible. WHAT FOODS CAN I EAT? Seek help from a dietitian for individual calorie needs. Grains Whole grain or whole wheat bread. Brown rice. Whole grain or whole wheat pasta. Quinoa, bulgur, and whole grain  cereals. Low-sodium cereals. Corn or whole wheat flour tortillas. Whole grain cornbread. Whole grain crackers. Low-sodium crackers. Vegetables Fresh or frozen vegetables (raw, steamed, roasted, or grilled). Low-sodium or reduced-sodium tomato and vegetable juices. Low-sodium or reduced-sodium tomato sauce and paste. Low-sodium or reduced-sodium canned vegetables.  Fruits All fresh, canned (in natural juice), or frozen fruits. Meat and Other Protein Products Ground beef (85% or leaner), grass-fed beef, or beef trimmed of fat. Skinless chicken or [place]. Ground chicken or [place]. Pork trimmed of fat. All fish and seafood. Eggs. Dried beans, peas, or lentils. Unsalted nuts and seeds. Unsalted canned beans. Dairy Low-fat dairy products, such as skim or 1% milk, 2% or reduced-fat cheeses, low-fat ricotta or cottage cheese, or plain low-fat yogurt. Low-sodium or reduced-sodium cheeses. Fats and Oils Tub margarines without trans fats. Light or reduced-fat mayonnaise and salad dressings (reduced sodium). Avocado. Safflower, olive, or canola oils. Natural peanut or almond butter. Other Unsalted popcorn and pretzels. The items listed above may not be a complete list of recommended foods or beverages. Contact your dietitian for more options. WHAT FOODS ARE NOT RECOMMENDED? Grains White bread. White pasta. White rice. Refined cornbread. Bagels and croissants. Crackers that contain trans fat. Vegetables Creamed or fried vegetables. Vegetables in a cheese sauce. Regular canned vegetables. Regular canned tomato sauce and paste. Regular tomato and vegetable juices. Fruits Dried fruits. Canned fruit in light or heavy syrup. Fruit juice. Meat and Other Protein Products Fatty cuts of meat. Ribs, chicken wings, bacon, sausage, bologna, salami, chitterlings, fatback, hot  dogs, bratwurst, and packaged luncheon meats. Salted nuts and seeds. Canned beans with salt. Dairy Whole or 2% milk, cream, half-and-half, and  cream cheese. Whole-fat or sweetened yogurt. Full-fat cheeses or blue cheese. Nondairy creamers and whipped toppings. Processed cheese, cheese spreads, or cheese curds. Condiments Onion and garlic salt, seasoned salt, table salt, and sea salt. Canned and packaged gravies. Worcestershire sauce. Tartar sauce. Barbecue sauce. Teriyaki sauce. Soy sauce, including reduced sodium. Steak sauce. Fish sauce. Oyster sauce. Cocktail sauce. Horseradish. Ketchup and mustard. Meat flavorings and tenderizers. Bouillon cubes. Hot sauce. Tabasco sauce. Marinades. Taco seasonings. Relishes. Fats and Oils Butter, stick margarine, lard, shortening, ghee, and bacon fat. Coconut, palm kernel, or palm oils. Regular salad dressings. Other Pickles and olives. Salted popcorn and pretzels. The items listed above may not be a complete list of foods and beverages to avoid. Contact your dietitian for more information. WHERE CAN I FIND MORE INFORMATION? National Heart, Lung, and Blood Institute: www.nhlbi.nih.gov/health/health-topics/topics/dash/ Document Released: 10/28/2011 Document Revised: 03/25/2014 Document Reviewed: 09/12/2013 ExitCare Patient Information 2015 [employer]. This information is not intended to replace advice given to you by your health care provider. Make sure you discuss any questions you have with your health care provider.  

## 2014-11-12 NOTE — Progress Notes (Signed)
Subjective:    Patient ID: Denise PiggSandra Potter, female    DOB: 01/08/1981, 33 y.o.   MRN: [id number]  HPI  This is a 33 year old female who is presenting for follow up of newly diagnosed HTN. She presented at another clinic for an employment physical 1 week ago and was found to have elevated BP. She went to the ED and they prescribed her HCTZ 25 mg. 1 day later I saw her and her BP remained high at 186/116. She was prescribed 10 mg amlodipine. Pt bought a wrist BP monitor and has recorded her BP daily since. It has been trending down - ranging 115-135/77-113. She is not having any headache, palpitations, chest pain, LE edema or dizziness. She does note since starting these medications she has felt more fatigued than normal.   Review of Systems  Constitutional: Negative for fever and chills.  Eyes: Negative for visual disturbance.  Respiratory: Negative for shortness of breath.   Cardiovascular: Negative for chest pain, palpitations and leg swelling.  Gastrointestinal: Negative for nausea and vomiting.  Neurological: Negative for dizziness and facial asymmetry.   Patient Active Problem List   Diagnosis Date Noted  . Essential hypertension 11/05/2014   Prior to Admission medications   Medication Sig Start Date End Date Taking? Authorizing Provider  amLODipine (NORVASC) 10 MG tablet Take 1 tablet (10 mg total) by mouth daily. 11/05/14  Yes Lanier ClamNicole Braelyn Jenson V, PA-C  hydrochlorothiazide (HYDRODIURIL) 25 MG tablet Take 1 tablet (25 mg total) by mouth daily. 11/12/14  Yes Lanier ClamNicole Derrius Furtick V, PA-C  ibuprofen (ADVIL,MOTRIN) 200 MG tablet Take 400 mg by mouth every 6 (six) hours as needed.     Yes Historical Provider, MD   No Known Allergies  Patient's social and family history were reviewed.    Objective:   Physical Exam  Constitutional: She is oriented to person, place, and time. She appears well-developed and well-nourished. No distress.  HENT:  Head: Normocephalic and atraumatic.  Right Ear: Hearing  normal.  Left Ear: Hearing normal.  Nose: Nose normal.  Eyes: Conjunctivae and lids are normal. Right eye exhibits no discharge. Left eye exhibits no discharge. No scleral icterus.  Cardiovascular: Normal rate, regular rhythm, normal heart sounds and normal pulses.   No murmur heard. Pulmonary/Chest: Effort normal and breath sounds normal. No respiratory distress. She has no wheezes. She has no rhonchi. She has no rales.  Musculoskeletal: Normal range of motion.  Neurological: She is alert and oriented to person, place, and time.  Skin: Skin is warm, dry and intact. No lesion and no rash noted.  Psychiatric: She has a normal mood and affect. Her speech is normal and behavior is normal. Thought content normal.  BP 128/90 mmHg  Pulse 88  Temp(Src) 97.8 F (36.6 C) (Oral)  Resp 18  Ht 5\' 6"  (1.676 m)  Wt 117 lb 6.4 oz (53.252 kg)  BMI 18.96 kg/m2  SpO2 100%  LMP 11/04/2014 (Exact Date)     Assessment & Plan:  1. Essential hypertension Pt's BP is trending downward. Today BP is 128/90 compared to 186/116 one week ago. She will continue on HCTZ and amlodipine. Tested her wrist monitor to manual and does not seem to be as accurate. Recommended that she return the wrist monitor and get an arm monitor instead. We discussed diet and salt intake. Provided information on DASH diet. She will return in 1 month for follow up. Will do a pap at that time as she is several years overdue.  -  hydrochlorothiazide (HYDRODIURIL) 25 MG tablet; Take 1 tablet (25 mg total) by mouth daily.  Dispense: 90 tablet; Refill: 0   Roswell MinersNicole V. Dyke BrackettBush, PA-C, [facility]  11/14/2014

## 2014-11-14 NOTE — Progress Notes (Signed)
The patient was discussed with me and I agree with the diagnosis and treatment plan.  

## 2017-04-11 ENCOUNTER — Encounter (HOSPITAL_BASED_OUTPATIENT_CLINIC_OR_DEPARTMENT_OTHER): Payer: Self-pay | Admitting: Emergency Medicine

## 2017-04-11 ENCOUNTER — Emergency Department (HOSPITAL_BASED_OUTPATIENT_CLINIC_OR_DEPARTMENT_OTHER)
Admission: EM | Admit: 2017-04-11 | Discharge: 2017-04-11 | Disposition: A | Discharge status: Home / Self Care | Payer: Self-pay | Attending: Emergency Medicine | Admitting: Emergency Medicine

## 2017-04-11 DIAGNOSIS — Z87891 Personal history of nicotine dependence: Secondary | ICD-10-CM | POA: Insufficient documentation

## 2017-04-11 DIAGNOSIS — K0889 Other specified disorders of teeth and supporting structures: Secondary | ICD-10-CM | POA: Insufficient documentation

## 2017-04-11 DIAGNOSIS — I10 Essential (primary) hypertension: Secondary | ICD-10-CM | POA: Insufficient documentation

## 2017-04-11 DIAGNOSIS — Z79899 Other long term (current) drug therapy: Secondary | ICD-10-CM | POA: Insufficient documentation

## 2017-04-11 MED ORDER — HYDROCODONE-ACETAMINOPHEN 5-325 MG PO TABS: 1.0000 | ORAL_TABLET | Freq: Four times a day (QID) | ORAL | 0 refills | Status: DC | PRN

## 2017-04-11 MED ORDER — PENICILLIN V POTASSIUM 250 MG PO TABS
500.0000 mg | ORAL_TABLET | Freq: Once | ORAL | Status: AC
  Administered 2017-04-11: 500 mg via ORAL

## 2017-04-11 MED ORDER — PENICILLIN V POTASSIUM 250 MG PO TABS
ORAL_TABLET | ORAL | Status: AC
  Filled 2017-04-11: qty 2

## 2017-04-11 MED ORDER — PENICILLIN V POTASSIUM 500 MG PO TABS: 500.0000 mg | ORAL_TABLET | Freq: Three times a day (TID) | ORAL | 0 refills | Status: DC

## 2017-04-11 NOTE — ED Triage Notes (Addendum)
Pt presents to ED with c/o left upper dental pain and facial swelling. Pt states she has not had her BP meds in about a week that she lost the bottles and would like see if she can get a prescription tonight.

## 2017-04-11 NOTE — Discharge Instructions (Signed)
Penicillin as prescribed.  Ibuprofen 600 mg every 6 hours as needed for pain. Hydrocodone as prescribed as needed for pain not relieved with ibuprofen.  Follow-up with your dentist in the next 2-3 days.

## 2017-04-11 NOTE — ED Provider Notes (Signed)
[facility]   CSN: [id number] Arrival date & time: 04/11/17  0029     History   Chief Complaint Chief Complaint  Patient presents with  . Dental Pain    HPI Denise Potter is a 36 y.o. female.  Patient is a 36 year old female with history of hypertension. She presents for evaluation of left sided upper tooth pain. This is been ongoing for the past several weeks, however is worsened over the past 2 days. She reports her dentist wants to repair the tooth, however is limited due to finances. She denies any new injury or trauma. She denies any fevers or chills. She does report some swelling to her left cheek.      Past Medical History:  Diagnosis Date  . Hypertension     Patient Active Problem List   Diagnosis Date Noted  . Essential hypertension 11/05/2014    Past Surgical History:  Procedure Laterality Date  . FEMUR SURGERY    . FRACTURE SURGERY      OB History    No data available       Home Medications    Prior to Admission medications   Medication Sig Start Date End Date Taking? Authorizing Provider  amLODipine (NORVASC) 10 MG tablet Take 1 tablet (10 mg total) by mouth daily. 11/05/14   Dorna LeitzBush, Nicole V, PA-C  hydrochlorothiazide (HYDRODIURIL) 25 MG tablet Take 1 tablet (25 mg total) by mouth daily. 11/12/14   Dorna LeitzBush, Nicole V, PA-C  ibuprofen (ADVIL,MOTRIN) 200 MG tablet Take 400 mg by mouth every 6 (six) hours as needed.      [provider]    Family History Family History  Problem Relation Age of Onset  . Hyperlipidemia Mother   . Hyperlipidemia Father   . Hyperlipidemia Maternal Grandfather     Social History Social History  Substance Use Topics  . Smoking status: Former Games developermoker  . Smokeless tobacco: Not on file  . Alcohol use No     Allergies   Patient has no known allergies.   Review of Systems Review of Systems  All other systems reviewed and are negative.    Physical Exam Updated Vital  Signs BP (!) 152/116 (BP Location: Right Arm)   Pulse 72   Temp 99 F (37.2 C) (Oral)   Resp 18   Ht 5\' 7"  (1.702 m)   Wt 130 lb (59 kg)   LMP 03/20/2017   SpO2 100%   BMI 20.36 kg/m   Physical Exam  Constitutional: She is oriented to person, place, and time. She appears well-developed and well-nourished. No distress.  HENT:  Head: Normocephalic and atraumatic.  There is tenderness to palpation of the left cheek, however no obvious swelling or abscess. The left upper bicuspid is broken off at the gumline. There is mild surrounding gingival inflammation, however no obvious abscess.  Neck: Normal range of motion. Neck supple.  Neurological: She is alert and oriented to person, place, and time.  Skin: Skin is warm and dry. She is not diaphoretic.  Nursing note and vitals reviewed.    ED Treatments / Results  Labs (all labs ordered are listed, but only abnormal results are displayed) Labs Reviewed - No data to display  EKG  EKG Interpretation None       Radiology No results found.  Procedures Procedures (including critical care time)  Medications Ordered in ED Medications  penicillin v potassium (VEETID) tablet 500 mg (not administered)     Initial Impression / Assessment  and Plan / ED Course  I have reviewed the triage vital signs and the nursing notes.  Pertinent labs & imaging results that were available during my care of the patient were reviewed by me and considered in my medical decision making (see chart for details).  Pain that is dental in nature. This will be treated with penicillin, pain medicine, and follow-up with her dentist.  Final Clinical Impressions(s) / ED Diagnoses   Final diagnoses:  None    New Prescriptions New Prescriptions   No medications on file     Geoffery Lyons, MD 04/11/17 [phone]

## 2017-06-05 ENCOUNTER — Emergency Department (HOSPITAL_BASED_OUTPATIENT_CLINIC_OR_DEPARTMENT_OTHER)
Admission: EM | Admit: 2017-06-05 | Discharge: 2017-06-05 | Disposition: A | Discharge status: Home / Self Care | Payer: Self-pay | Attending: Emergency Medicine | Admitting: Emergency Medicine

## 2017-06-05 ENCOUNTER — Encounter (HOSPITAL_BASED_OUTPATIENT_CLINIC_OR_DEPARTMENT_OTHER): Payer: Self-pay

## 2017-06-05 DIAGNOSIS — S0003XA Contusion of scalp, initial encounter: Secondary | ICD-10-CM

## 2017-06-05 DIAGNOSIS — R51 Headache: Secondary | ICD-10-CM | POA: Insufficient documentation

## 2017-06-05 DIAGNOSIS — Z87891 Personal history of nicotine dependence: Secondary | ICD-10-CM | POA: Insufficient documentation

## 2017-06-05 DIAGNOSIS — I10 Essential (primary) hypertension: Secondary | ICD-10-CM | POA: Insufficient documentation

## 2017-06-05 DIAGNOSIS — Z79899 Other long term (current) drug therapy: Secondary | ICD-10-CM | POA: Insufficient documentation
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
## 2017-06-05 NOTE — Discharge Instructions (Signed)
Tylenol 1000 mg every 6 hours as needed for pain.  Return to the emergency department if you develop worsening headache, confusion, visual disturbances, or other new and concerning symptoms.
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
## 2019-03-07 ENCOUNTER — Telehealth: Payer: Self-pay | Admitting: General Practice

## 2019-03-07 NOTE — Telephone Encounter (Signed)
Pt calling, no PCP in system. Pt hung up/ call dropped once tranferred. Attempted CB x 2. Busy.

## 2019-08-28 ENCOUNTER — Encounter (HOSPITAL_BASED_OUTPATIENT_CLINIC_OR_DEPARTMENT_OTHER): Payer: Self-pay | Admitting: Emergency Medicine

## 2019-08-28 ENCOUNTER — Emergency Department (HOSPITAL_BASED_OUTPATIENT_CLINIC_OR_DEPARTMENT_OTHER)
Admission: EM | Admit: 2019-08-28 | Discharge: 2019-08-28 | Disposition: A | Discharge status: Home / Self Care | Payer: Self-pay | Attending: Emergency Medicine | Admitting: Emergency Medicine

## 2019-08-28 ENCOUNTER — Other Ambulatory Visit: Payer: Self-pay

## 2019-08-28 DIAGNOSIS — N764 Abscess of vulva: Secondary | ICD-10-CM | POA: Insufficient documentation

## 2019-08-28 DIAGNOSIS — Z5321 Procedure and treatment not carried out due to patient leaving prior to being seen by health care provider: Secondary | ICD-10-CM | POA: Insufficient documentation

## 2019-08-28 NOTE — ED Triage Notes (Signed)
Vaginal abscess x 2 days.

## 2019-08-28 NOTE — ED Notes (Signed)
Called for room x 2, no answer. Pt is not down in the bistro area or outside ED doors

## 2020-12-23 ENCOUNTER — Encounter: Payer: Self-pay | Admitting: Emergency Medicine

## 2020-12-23 ENCOUNTER — Emergency Department (INDEPENDENT_AMBULATORY_CARE_PROVIDER_SITE_OTHER)
Admission: EM | Admit: 2020-12-23 | Discharge: 2020-12-23 | Disposition: A | Discharge status: Home / Self Care | Payer: No Typology Code available for payment source | Source: Home / Self Care

## 2020-12-23 ENCOUNTER — Other Ambulatory Visit: Payer: Self-pay

## 2020-12-23 ENCOUNTER — Emergency Department (INDEPENDENT_AMBULATORY_CARE_PROVIDER_SITE_OTHER): Payer: No Typology Code available for payment source

## 2020-12-23 DIAGNOSIS — W231XXA Caught, crushed, jammed, or pinched between stationary objects, initial encounter: Secondary | ICD-10-CM | POA: Diagnosis not present

## 2020-12-23 DIAGNOSIS — I10 Essential (primary) hypertension: Secondary | ICD-10-CM

## 2020-12-23 DIAGNOSIS — R600 Localized edema: Secondary | ICD-10-CM | POA: Diagnosis not present

## 2020-12-23 DIAGNOSIS — L03012 Cellulitis of left finger: Secondary | ICD-10-CM

## 2020-12-23 DIAGNOSIS — S6722XA Crushing injury of left hand, initial encounter: Secondary | ICD-10-CM | POA: Diagnosis not present

## 2020-12-23 DIAGNOSIS — Y990 Civilian activity done for income or pay: Secondary | ICD-10-CM

## 2020-12-23 MED ORDER — NAPROXEN 500 MG PO TABS: 500.0000 mg | ORAL_TABLET | Freq: Two times a day (BID) | ORAL | 0 refills | Status: DC

## 2020-12-23 MED ORDER — DOXYCYCLINE HYCLATE 100 MG PO CAPS
100.0000 mg | ORAL_CAPSULE | Freq: Two times a day (BID) | ORAL | 0 refills | Status: DC
Start: 2020-12-23 — End: 2022-11-03

## 2020-12-23 MED ORDER — AMLODIPINE BESYLATE 10 MG PO TABS: 10.0000 mg | ORAL_TABLET | Freq: Every day | ORAL | 0 refills | Status: DC

## 2020-12-23 NOTE — ED Triage Notes (Signed)
Making boxes yesterday putting gel packs in boxes stuck her Left hand in frozen gel packs and felt pain, hand started stinging and burning gel packs were leaking. Hand is swollen and painful.

## 2020-12-23 NOTE — ED Provider Notes (Signed)
[facility]    CSN: [id number] Arrival date & time: 12/23/20  1654      History   Chief Complaint Chief Complaint  Patient presents with  . Hand Pain    HPI Denise Potter is a 40 y.o. female.   HPI Patient presents today to urgent care following the injury she sustained while at work yesterday.  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  She immediately experienced pain and is concerned that substances inside of the gel pack may have leaked onto her hand.  She has tenderness at the metacarpal joints 2 through 4 and specifically the middle finger is swollen she is unable to flex it and it is reddened.  No obvious bruises involving the other digits or other portions of the hand.  Patient reports last tetanus approximately 3 years ago  Patient with a history of known hypertension is very hypertensive on arrival today.  She has not been taking her medications in over 2 months and has been attempting to control blood pressure with food.  She is asymptomatic at present.  Previously prescribed hydrochlorothiazide, amlodipine, and losartan individually at some point.  She is not having any chest pain or shortness of breath or dizziness at present. Past Medical History:  Diagnosis Date  . Hypertension     Patient Active Problem List   Diagnosis Date Noted  . Essential hypertension 11/05/2014    Past Surgical History:  Procedure Laterality Date  . FEMUR SURGERY    . FRACTURE SURGERY      OB History   No obstetric history on file.      Home Medications    Prior to Admission medications   Medication Sig Start Date End Date Taking? Authorizing Provider  ibuprofen (ADVIL,MOTRIN) 200 MG tablet Take 400 mg by mouth every 6 (six) hours as needed.      [provider]    Family History Family History  Problem Relation Age of Onset   . Hyperlipidemia Mother   . Hyperlipidemia Father   . Hyperlipidemia Maternal Grandfather     Social History Social History   Tobacco Use  . Smoking status: Former Games developer  . Smokeless tobacco: Never Used  Vaping Use  . Vaping Use: Every day  Substance Use Topics  . Alcohol use: Yes  . Drug use: No     Allergies   Patient has no known allergies.   Review of Systems Review of Systems Pertinent negatives listed in HPI  Physical Exam Triage Vital Signs ED Triage Vitals  Enc Vitals Group     BP 12/23/20 1713 (!) 189/133     Pulse Rate 12/23/20 1713 84     Resp 12/23/20 1713 18     Temp 12/23/20 1713 98.6 F (37 C)     Temp Source 12/23/20 1713 Oral     SpO2 12/23/20 1713 100 %     Weight 12/23/20 1715 130 lb (59 kg)     Height 12/23/20 1715 5\' 7"  (1.702 m)     Head Circumference --      Peak Flow --      Pain Score 12/23/20 1714 10     Pain Loc --      Pain Edu? --      Excl. in GC? --    No data found.  Updated Vital Signs BP 02/20/21)  189/133 (BP Location: Right Arm) Comment: Hasn't taken bp meds in 2 months trying to control with food  Pulse 84   Temp 98.6 F (37 C) (Oral)   Resp 18   Ht 5\' 7"  (1.702 m)   Wt 130 lb (59 kg)   SpO2 100%   BMI 20.36 kg/m   Visual Acuity Right Eye Distance:   Left Eye Distance:   Bilateral Distance:    Right Eye Near:   Left Eye Near:    Bilateral Near:     Physical Exam  General appearance: alert, well developed, well nourished, cooperative  Head: Normocephalic, without obvious abnormality, atraumatic Respiratory: Respirations even and unlabored, normal respiratory rate Heart:ate and rhythm normal. No gallop or murmurs noted on exam  Extremities: Left hand pain with movement, left finger diffuse swelling from the base extending to the distal finger with erythema and palpable tenderness at the palmar and dorsum surfaces of the 3 rd finger.  Third finger unable to flex Skin: Skin color, texture, turgor normal. No  rashes seen  Psych: Appropriate mood and affect. Neurologic: Mental status: Alert, oriented to person, place, and time, thought content appropriate.  UC Treatments / Results  Labs (all labs ordered are listed, but only abnormal results are displayed) Labs Reviewed - No data to display  EKG   Radiology No results found.  Procedures Procedures (including critical care time)  Medications Ordered in UC Medications - No data to display  Initial Impression / Assessment and Plan / UC Course  I have reviewed the triage vital signs and the nursing notes.  Pertinent labs & imaging results that were available during my care of the patient were reviewed by me and considered in my medical decision making (see chart for details).    Patient seen today in clinic for rate related injury involving crushing of left hand with injury specifically to left middle finger resulting in a secondary cellulitis.  Starting on doxycycline 100 mg twice daily for 10 days.  Work note provided patient will follow up with occupational medicine for evaluation for return to work.  Advised no additional days off will be provided from urgent care as we do not processed Worker's Comp.  Patient's blood pressures accelerated on arrival has a history of poorly controlled hypertension agreed to refill amlodipine for total of 60 doses patient was advised to follow-up with primary care. Final Clinical Impressions(s) / UC Diagnoses   Final diagnoses:  Crushing injury of left hand, initial encounter  Cellulitis of left middle finger  Work related injury  Essential hypertension     Discharge Instructions     X-ray is negative for fracture. Given redness and swelling around the nailbed of 3rd finger, Splinting your third digit for comfort and to prevent reinjury.   You may remove and reapply splint as needed. I am starting you on an antibiotic for skin infection secondary to injury.  For pain, start Naprosyn 500 mg twice  daily. Contact occupational medicine on tomorrow you will need to be seen by other clinic to be cleared to return back to work.  Schedule follow-up visit with occupational medicine on or Friday, 12/26/2020.    ED Prescriptions    Medication Sig Dispense Auth. Provider   doxycycline (VIBRAMYCIN) 100 MG capsule Take 1 capsule (100 mg total) by mouth 2 (two) times daily. 20 capsule 02/23/2021, FNP   naproxen (NAPROSYN) 500 MG tablet Take 1 tablet (500 mg total) by mouth 2 (two) times daily with a  meal. 30 tablet Bing Neighbors, FNP   amLODipine (NORVASC) 10 MG tablet Take 1 tablet (10 mg total) by mouth daily. 60 tablet Bing Neighbors, FNP     PDMP not reviewed this encounter.   Bing Neighbors, FNP 12/23/20 [phone]

## 2020-12-23 NOTE — Discharge Instructions (Addendum)
X-ray is negative for fracture. Given redness and swelling around the nailbed of 3rd finger, Splinting your third digit for comfort and to prevent reinjury.   You may remove and reapply splint as needed. I am starting you on an antibiotic for skin infection secondary to injury.  For pain, start Naprosyn 500 mg twice daily. Contact occupational medicine on tomorrow you will need to be seen by other clinic to be cleared to return back to work.  Schedule follow-up visit with occupational medicine on or Friday, 12/26/2020.

## 2021-09-18 DIAGNOSIS — M546 Pain in thoracic spine: Secondary | ICD-10-CM | POA: Diagnosis not present

## 2021-09-18 DIAGNOSIS — I10 Essential (primary) hypertension: Secondary | ICD-10-CM | POA: Insufficient documentation

## 2021-09-18 DIAGNOSIS — Z87891 Personal history of nicotine dependence: Secondary | ICD-10-CM | POA: Insufficient documentation

## 2021-09-18 DIAGNOSIS — S161XXA Strain of muscle, fascia and tendon at neck level, initial encounter: Secondary | ICD-10-CM | POA: Insufficient documentation

## 2021-09-18 DIAGNOSIS — Z79899 Other long term (current) drug therapy: Secondary | ICD-10-CM | POA: Diagnosis not present

## 2021-09-18 DIAGNOSIS — Y92410 Unspecified street and highway as the place of occurrence of the external cause: Secondary | ICD-10-CM | POA: Insufficient documentation

## 2021-09-18 DIAGNOSIS — S199XXA Unspecified injury of neck, initial encounter: Secondary | ICD-10-CM | POA: Diagnosis present

## 2021-09-19 ENCOUNTER — Encounter (HOSPITAL_BASED_OUTPATIENT_CLINIC_OR_DEPARTMENT_OTHER): Payer: Self-pay | Admitting: Emergency Medicine

## 2021-09-19 ENCOUNTER — Telehealth (HOSPITAL_BASED_OUTPATIENT_CLINIC_OR_DEPARTMENT_OTHER): Payer: Self-pay | Admitting: Medical

## 2021-09-19 ENCOUNTER — Emergency Department (HOSPITAL_BASED_OUTPATIENT_CLINIC_OR_DEPARTMENT_OTHER): Payer: Medicaid Other

## 2021-09-19 ENCOUNTER — Emergency Department (HOSPITAL_BASED_OUTPATIENT_CLINIC_OR_DEPARTMENT_OTHER)
Admission: EM | Admit: 2021-09-19 | Discharge: 2021-09-19 | Disposition: A | Discharge status: Home / Self Care | Payer: Medicaid Other | Attending: Emergency Medicine | Admitting: Emergency Medicine

## 2021-09-19 DIAGNOSIS — T148XXA Other injury of unspecified body region, initial encounter: Secondary | ICD-10-CM

## 2021-09-19 DIAGNOSIS — M62838 Other muscle spasm: Secondary | ICD-10-CM

## 2021-09-19 LAB — PREGNANCY, URINE: Preg Test, Ur: NEGATIVE

## 2021-09-19 MED ORDER — NAPROXEN 375 MG PO TABS: 375.0000 mg | ORAL_TABLET | Freq: Two times a day (BID) | ORAL | 0 refills | Status: DC

## 2021-09-19 MED ORDER — METHOCARBAMOL 500 MG PO TABS: 500.0000 mg | ORAL_TABLET | Freq: Two times a day (BID) | ORAL | 0 refills | Status: AC

## 2021-09-19 MED ORDER — NAPROXEN 375 MG PO TABS: 375.0000 mg | ORAL_TABLET | Freq: Two times a day (BID) | ORAL | 0 refills | Status: AC

## 2021-09-19 MED ORDER — OXYCODONE-ACETAMINOPHEN 5-325 MG PO TABS
1.0000 | ORAL_TABLET | ORAL | Status: DC | PRN
  Administered 2021-09-19: 1 via ORAL
  Filled 2021-09-19: qty 1

## 2021-09-19 MED ORDER — KETOROLAC TROMETHAMINE 60 MG/2ML IM SOLN
30.0000 mg | Freq: Once | INTRAMUSCULAR | Status: AC
  Administered 2021-09-19: 30 mg via INTRAMUSCULAR
  Filled 2021-09-19: qty 2
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
## 2021-09-19 NOTE — Discharge Instructions (Addendum)
You may use over-the-counter Motrin (Ibuprofen), Acetaminophen (Tylenol), topical muscle creams such as SalonPas, Icy Hot, Bengay, etc. Please stretch, apply ice or heat (whichever helps), and have massage therapy for additional assistance.  

## 2021-09-19 NOTE — Telephone Encounter (Signed)
Pt called [facility] requesting meds from her previous ED visit be sent to another pharmacy. Robaxin and Naproxen sent to pharmacy of her choosing.

## 2021-10-21 ENCOUNTER — Ambulatory Visit: Payer: Medicaid Other | Attending: Sports Medicine | Admitting: Physical Therapy

## 2021-10-21 ENCOUNTER — Other Ambulatory Visit: Payer: Self-pay

## 2021-10-21 DIAGNOSIS — M5442 Lumbago with sciatica, left side: Secondary | ICD-10-CM | POA: Diagnosis present

## 2021-10-21 DIAGNOSIS — M6281 Muscle weakness (generalized): Secondary | ICD-10-CM | POA: Diagnosis present

## 2021-10-21 DIAGNOSIS — M542 Cervicalgia: Secondary | ICD-10-CM

## 2021-10-21 DIAGNOSIS — R2689 Other abnormalities of gait and mobility: Secondary | ICD-10-CM

## 2021-10-21 DIAGNOSIS — R293 Abnormal posture: Secondary | ICD-10-CM
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
## 2021-10-28 ENCOUNTER — Other Ambulatory Visit: Payer: Self-pay

## 2021-10-28 ENCOUNTER — Ambulatory Visit: Payer: Medicaid Other | Attending: Sports Medicine

## 2021-10-28 DIAGNOSIS — M5442 Lumbago with sciatica, left side: Secondary | ICD-10-CM | POA: Diagnosis present

## 2021-10-28 DIAGNOSIS — M6281 Muscle weakness (generalized): Secondary | ICD-10-CM | POA: Diagnosis not present

## 2021-10-28 DIAGNOSIS — R293 Abnormal posture: Secondary | ICD-10-CM | POA: Diagnosis present

## 2021-10-28 DIAGNOSIS — M542 Cervicalgia: Secondary | ICD-10-CM | POA: Diagnosis present

## 2021-10-28 DIAGNOSIS — R2689 Other abnormalities of gait and mobility: Secondary | ICD-10-CM | POA: Diagnosis present

## 2021-10-28 NOTE — Patient Instructions (Signed)
Access Code: [email or web address] URL: [email or web address] Date: 10/28/2021 Prepared by: Verta Ellen  Exercises Seated Cervical Retraction - 1 x daily - 7 x weekly - 2 sets - 10 reps - 5 second hold Seated Upper Trapezius Stretch - 1 x daily - 7 x weekly - 3 sets - 3 reps - 30 second hold Gentle Levator Scapulae Stretch - 1 x daily - 7 x weekly - 3 sets - 3 reps - 30 seconds hold Supine Posterior Pelvic Tilt - 1 x daily - 7 x weekly - 3 sets - 10 reps - 5 sec hold
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
## 2021-10-30 ENCOUNTER — Ambulatory Visit: Payer: Medicaid Other

## 2021-11-03 ENCOUNTER — Ambulatory Visit: Payer: Medicaid Other | Admitting: Physical Therapy

## 2021-11-09 ENCOUNTER — Encounter: Payer: Medicaid Other | Admitting: Physical Therapy

## 2021-11-11 ENCOUNTER — Encounter: Payer: Medicaid Other | Admitting: Physical Therapy

## 2021-11-24 ENCOUNTER — Encounter: Payer: Medicaid Other | Admitting: Physical Therapy
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
## 2022-11-03 ENCOUNTER — Emergency Department (HOSPITAL_BASED_OUTPATIENT_CLINIC_OR_DEPARTMENT_OTHER)
Admission: EM | Admit: 2022-11-03 | Discharge: 2022-11-03 | Disposition: A | Discharge status: Home / Self Care | Payer: Medicaid Other | Attending: Emergency Medicine | Admitting: Emergency Medicine

## 2022-11-03 ENCOUNTER — Other Ambulatory Visit: Payer: Self-pay

## 2022-11-03 ENCOUNTER — Emergency Department (HOSPITAL_BASED_OUTPATIENT_CLINIC_OR_DEPARTMENT_OTHER): Payer: Medicaid Other

## 2022-11-03 ENCOUNTER — Encounter (HOSPITAL_BASED_OUTPATIENT_CLINIC_OR_DEPARTMENT_OTHER): Payer: Self-pay

## 2022-11-03 DIAGNOSIS — I10 Essential (primary) hypertension: Secondary | ICD-10-CM | POA: Insufficient documentation

## 2022-11-03 DIAGNOSIS — Z79899 Other long term (current) drug therapy: Secondary | ICD-10-CM | POA: Diagnosis not present

## 2022-11-03 DIAGNOSIS — K0889 Other specified disorders of teeth and supporting structures: Secondary | ICD-10-CM | POA: Insufficient documentation

## 2022-11-03 DIAGNOSIS — R030 Elevated blood-pressure reading, without diagnosis of hypertension: Secondary | ICD-10-CM

## 2022-11-03 LAB — URINALYSIS, ROUTINE W REFLEX MICROSCOPIC
Bilirubin Urine: NEGATIVE
Glucose, UA: NEGATIVE mg/dL
Hgb urine dipstick: NEGATIVE
Ketones, ur: NEGATIVE mg/dL
Nitrite: NEGATIVE
Protein, ur: NEGATIVE mg/dL
Specific Gravity, Urine: 1.015 (ref 1.005–1.030)
pH: 7 (ref 5.0–8.0)

## 2022-11-03 LAB — BASIC METABOLIC PANEL
Anion gap: 6 (ref 5–15)
BUN: 12 mg/dL (ref 6–20)
CO2: 24 mmol/L (ref 22–32)
Calcium: 8.6 mg/dL — ABNORMAL LOW (ref 8.9–10.3)
Chloride: 106 mmol/L (ref 98–111)
Creatinine, Ser: 0.61 mg/dL (ref 0.44–1.00)
GFR, Estimated: >60 mL/min (ref >60)
Glucose, Bld: 90 mg/dL (ref 70–99)
Potassium: 3.8 mmol/L (ref 3.5–5.1)
Sodium: 136 mmol/L (ref 135–145)

## 2022-11-03 LAB — CBC
HCT: 31.6 % — ABNORMAL LOW (ref 36.0–46.0)
Hemoglobin: 9.6 g/dL — ABNORMAL LOW (ref 12.0–15.0)
MCH: 26.7 pg (ref 26.0–34.0)
MCHC: 30.4 g/dL (ref 30.0–36.0)
MCV: 88 fL (ref 80.0–100.0)
Platelets: 308 10*3/uL (ref 150–400)
RBC: 3.59 MIL/uL — ABNORMAL LOW (ref 3.87–5.11)
RDW: 15.4 % (ref 11.5–15.5)
WBC: 10.1 10*3/uL (ref 4.0–10.5)
nRBC: 0 % (ref 0.0–0.2)

## 2022-11-03 LAB — URINALYSIS, MICROSCOPIC (REFLEX)

## 2022-11-03 LAB — PREGNANCY, URINE: Preg Test, Ur: NEGATIVE

## 2022-11-03 MED ORDER — AMLODIPINE BESYLATE 10 MG PO TABS: 10.0000 mg | ORAL_TABLET | Freq: Every day | ORAL | 0 refills | Status: AC

## 2022-11-03 MED ORDER — HYDROCODONE-ACETAMINOPHEN 5-325 MG PO TABS: 1.0000 | ORAL_TABLET | Freq: Four times a day (QID) | ORAL | 0 refills | Status: AC | PRN

## 2022-11-03 MED ORDER — KETOROLAC TROMETHAMINE 15 MG/ML IJ SOLN
15.0000 mg | Freq: Once | INTRAMUSCULAR | Status: AC
  Administered 2022-11-03: 15 mg via INTRAVENOUS
  Filled 2022-11-03: qty 1

## 2022-11-03 MED ORDER — SODIUM CHLORIDE 0.9 % IV BOLUS
500.0000 mL | Freq: Once | INTRAVENOUS | Status: AC
Start: 2022-11-03 — End: 2022-11-03
  Administered 2022-11-03: 500 mL via INTRAVENOUS

## 2022-11-03 MED ORDER — AMLODIPINE BESYLATE 5 MG PO TABS
10.0000 mg | ORAL_TABLET | Freq: Once | ORAL | Status: AC
  Administered 2022-11-03: 10 mg via ORAL
  Filled 2022-11-03: qty 2

## 2022-11-03 MED ORDER — HYDRALAZINE HCL 20 MG/ML IJ SOLN
10.0000 mg | Freq: Once | INTRAMUSCULAR | Status: AC
  Administered 2022-11-03: 10 mg via INTRAVENOUS
  Filled 2022-11-03: qty 1

## 2022-11-03 MED ORDER — AMOXICILLIN-POT CLAVULANATE 875-125 MG PO TABS
1.0000 | ORAL_TABLET | Freq: Once | ORAL | Status: AC
  Administered 2022-11-03: 1 via ORAL
  Filled 2022-11-03: qty 1

## 2022-11-03 MED ORDER — AMOXICILLIN-POT CLAVULANATE 875-125 MG PO TABS: 1.0000 | ORAL_TABLET | Freq: Two times a day (BID) | ORAL | 0 refills | Status: AC

## 2022-11-03 MED ORDER — OXYCODONE-ACETAMINOPHEN 5-325 MG PO TABS
1.0000 | ORAL_TABLET | Freq: Once | ORAL | Status: AC
  Administered 2022-11-03: 1 via ORAL
  Filled 2022-11-03: qty 1

## 2022-11-03 NOTE — ED Triage Notes (Signed)
Pt presents with dental pain on the bottom left. Pt has a dental appointment next Thursday.

## 2022-11-03 NOTE — Discharge Instructions (Addendum)
You were evaluated in the emergency department today for dental pain.  An antibiotic has been sent to your pharmacy, take this as prescribed and with plenty food and water.  A short course of Vicodin has also been sent to your pharmacy.  This is a narcotic medication, do not drive or operate machinery after taking this medication.  You may take this for breakthrough pain that ibuprofen and Tylenol do not help.  Keep in mind that Vicodin has 325 mg of Tylenol in it, do not exceed 1000 mg of Tylenol every 6 hours (4000 mg/day).  You have also been provided the contact information for local dentists.  You may call to schedule an appointment to be seen more promptly.  A refill prescription for your amlodipine is also been sent to your pharmacy.  In addition to resources/contact information have been provided for you for local primary care offices.  Please call to schedule an appointment to establish care for continued blood pressure management.  Continue to monitor your blood pressure from home.  Return to the ED for new or worsening symptoms as discussed.  Please have your BP rechecked within the next few days

## 2022-11-03 NOTE — ED Provider Notes (Signed)
Care assumed from Kathie Dike, PA-C at shift change pending BP improvement.  See her note for full HPI.  In short, patient is a 41 year old female with history of hypertension presents to the ED due to left lower dental pain.  Patient had a filling recently and it broke off.  She admits to shooting left-sided pain that goes into her head.  Patient has a dental appointment next Thursday.  Of note, patient has been out of her amlodipine for 1 month.  Plan from previous provider: d/c patient once BP improves Physical Exam  BP (!) 188/121   Pulse 61   Temp 98.7 F (37.1 C) (Oral)   Resp 16   Ht 5\' 7"  (1.702 m)   Wt 70.7 kg   LMP 10/12/2022   SpO2 100%   BMI 24.40 kg/m   Physical Exam Vitals and nursing note reviewed.  Constitutional:      General: She is not in acute distress.    Appearance: She is not ill-appearing.  HENT:     Head: Normocephalic.     Mouth/Throat:     Comments: Chipped left lower posterior molar.  No abscess.  Tongue in normal position without protrusion.  No trismus.  Airway patent. Eyes:     Pupils: Pupils are equal, round, and reactive to light.  Cardiovascular:     Rate and Rhythm: Normal rate and regular rhythm.     Pulses: Normal pulses.     Heart sounds: Normal heart sounds. No murmur heard.    No friction rub. No gallop.  Pulmonary:     Effort: Pulmonary effort is normal.     Breath sounds: Normal breath sounds.  Abdominal:     General: Abdomen is flat. There is no distension.     Palpations: Abdomen is soft.     Tenderness: There is no abdominal tenderness. There is no guarding or rebound.  Musculoskeletal:        General: Normal range of motion.     Cervical back: Neck supple.  Skin:    General: Skin is warm and dry.  Neurological:     General: No focal deficit present.     Mental Status: She is alert.  Psychiatric:        Mood and Affect: Mood normal.        Behavior: Behavior normal.     Procedures  Procedures  ED Course / MDM     Medical Decision Making Amount and/or Complexity of Data Reviewed Labs: ordered. Decision-making details documented in ED Course. Radiology: ordered and independent interpretation performed. Decision-making details documented in ED Course.  Risk Prescription drug management.   41 year old female presents ED due to left lower dental pain.  Assumed care from 46, PA-C at shift change pending BP improvement.  Patient has been out of her amlodipine for 1 month which is likely why her BP is elevated in addition to her dental pain.  I reviewed labs and imaging from previous provider.  CT head negative for any intracranial abnormalities.  CBC significant for anemia with hemoglobin 9.6.  Patient denies any bleeding.  No baseline hemoglobin.  No leukocytosis.  BMP reassuring.  Normal renal function.  UA significant for small leukocytes and rare bacteria.  No urinary symptoms.  No evidence of endorgan damage.  Lower suspicion for hypertensive urgency or emergency.  Feel patient's BP is elevated due to dental pain and patient noncompliance.  8:32 PM reassessed patient at bedside.  Patient admits to worsening left lower dental  pain.  On physical exam, patient has dental caries with a chipped left lower posterior molar.  Tongue in normal position without protrusion.  No facial edema.  Low suspicion for Ludwig's or deep space infection.  Suspect elevated blood pressure related to dental pain and medication noncompliance given patient ran out of her amlodipine 1 month ago.  Will obtain pregnancy test and give Toradol.  If BP is improved after pain is controlled will discharge patient with amlodipine and symptomatic treatment for her dental pain per previous provider.  BP improved after pain management to 160s. Low suspicion for hypertensive urgency/emergency. Suspect BP elevated due to dental pain and medication noncompliance. Discharge paperwork per previous provider. Patient discharged with refill  on HTN medications and antibiotic for dental infection. Strict ED precautions discussed with patient. Patient states understanding and agrees to plan. Patient discharged home in no acute distress and stable vitals.       Jesusita Oka 11/03/22 2144    Tegeler, Canary Brim, MD 11/03/22 [phone]

## 2022-11-03 NOTE — ED Notes (Signed)
Pt reports she has not been taking her BP medication for undetermined amount of time

## 2022-11-03 NOTE — ED Triage Notes (Signed)
Pt noted to have elevated B/P during triage exam. Pt states she has been without her medication for 1 month.

## 2022-11-03 NOTE — ED Provider Notes (Signed)
[facility] EMERGENCY DEPARTMENT Provider Note   CSN: [id number] Arrival date & time: 11/03/22  1541     History  Chief Complaint  Patient presents with   Dental Pain    Denise Potter is a 41 y.o. female hx of HTN presenting to the ED today due to dental pain.  States last week on Thursday a dental filling on her lower left molar became dislodged.  Began feeling pain within a few hours.  Pain is increased up until today.  Was able to contact her dentist, however states she is not able to until about 2 weeks from now.  Denies chest pain, shortness of breath, headache, vision changes, though does endorse significant pain radiating up the left side of her jaw towards the ear.  States she feels like there is pressure in her ear and that its "about to bust".  Denies difficulty swallowing or voice changes, though with some mild left-sided neck pain.  Has been taking 3 to 5 tablets of diclofenac 75 mg/day for the last 8 to 9 days-the prescription was given to her aunt for neck pain.  States she has been out of her blood pressure medication for 1 month, noted to have elevated blood pressure in triage of 210/110.  The history is provided by the patient and medical records.  Dental Pain Associated symptoms: headaches        Home Medications Prior to Admission medications   Medication Sig Start Date End Date Taking? Authorizing Provider  amoxicillin-clavulanate (AUGMENTIN) 875-125 MG tablet Take 1 tablet by mouth every 12 (twelve) hours for 10 days. 11/03/22 11/13/22 Yes Cecil Cobbs, PA-C  HYDROcodone-acetaminophen (NORCO/VICODIN) 5-325 MG tablet Take 1-2 tablets by mouth every 6 (six) hours as needed for severe pain. 11/03/22  Yes Cecil Cobbs, PA-C  amLODipine (NORVASC) 10 MG tablet Take 1 tablet (10 mg total) by mouth daily. 11/03/22   Cecil Cobbs, PA-C  ibuprofen (ADVIL,MOTRIN) 200 MG tablet Take 400 mg by mouth every 6 (six) hours as needed.       [provider]  methocarbamol (ROBAXIN) 500 MG tablet Take 1 tablet (500 mg total) by mouth 2 (two) times daily. 09/19/21   Cardama, Amadeo Garnet, MD  methocarbamol (ROBAXIN) 500 MG tablet Take 1 tablet (500 mg total) by mouth 2 (two) times daily. 09/19/21   Tanda Rockers, PA-C      Allergies    Patient has no known allergies.    Review of Systems   Review of Systems  HENT:  Positive for dental problem.   Neurological:  Positive for headaches.    Physical Exam Updated Vital Signs BP (!) 188/121   Pulse 61   Temp 98.7 F (37.1 C) (Oral)   Resp 16   Ht 5\' 7"  (1.702 m)   Wt 70.7 kg   LMP 10/12/2022   SpO2 100%   BMI 24.40 kg/m  Physical Exam Vitals and nursing note reviewed.  Constitutional:      General: She is not in acute distress.    Appearance: She is well-developed. She is not ill-appearing, toxic-appearing or diaphoretic.  HENT:     Head: Normocephalic and atraumatic.     Comments: Uvula midline, without swelling.  Airway patent.  Tenderness of the left lower first molar, with apparent dental fracture and caries.  Mildly poor dentition overall.  No dental abscess or drainage appreciated.  No significant gingivitis.  Tongue without swelling or elevation or deviation.    Right Ear: Tympanic membrane,  ear canal and external ear normal.     Left Ear: Tympanic membrane, ear canal and external ear normal.     Ears:     Comments: Hearing intact grossly to voice.    Nose: Nose normal.     Mouth/Throat:     Mouth: Mucous membranes are moist.     Pharynx: Oropharynx is clear. No posterior oropharyngeal erythema.  Eyes:     Conjunctiva/sclera: Conjunctivae normal.  Neck:     Comments: Mild left-sided submandibular tenderness without significant swelling or mass appreciated.  No cervical lymphadenopathy.  Neck very supple on exam, no meningismus or torticollis Cardiovascular:     Rate and Rhythm: Normal rate and regular rhythm.     Heart sounds: Murmur heard.   Pulmonary:     Effort: Pulmonary effort is normal. No respiratory distress.     Breath sounds: Normal breath sounds.     Comments: CTAB, able to communicate without difficulty, without increased respiratory effort Abdominal:     Palpations: Abdomen is soft.     Tenderness: There is no abdominal tenderness.  Musculoskeletal:        General: No swelling.     Cervical back: Neck supple. No rigidity.  Skin:    General: Skin is warm and dry.     Capillary Refill: Capillary refill takes less than 2 seconds.     Coloration: Skin is not jaundiced or pale.     Findings: No erythema.  Neurological:     Mental Status: She is alert.  Psychiatric:        Mood and Affect: Mood normal.     ED Results / Procedures / Treatments   Labs (all labs ordered are listed, but only abnormal results are displayed) Labs Reviewed  CBC - Abnormal; Notable for the following components:      Result Value   RBC 3.59 (*)    Hemoglobin 9.6 (*)    HCT 31.6 (*)    All other components within normal limits  BASIC METABOLIC PANEL - Abnormal; Notable for the following components:   Calcium 8.6 (*)    All other components within normal limits  URINALYSIS, ROUTINE W REFLEX MICROSCOPIC - Abnormal; Notable for the following components:   Leukocytes,Ua SMALL (*)    All other components within normal limits  URINALYSIS, MICROSCOPIC (REFLEX) - Abnormal; Notable for the following components:   Bacteria, UA RARE (*)    All other components within normal limits    EKG EKG Interpretation  Date/Time:  Wednesday November 03 2022 15:56:58 EST Ventricular Rate:  73 PR Interval:  158 QRS Duration: 70 QT Interval:  394 QTC Calculation: 434 R Axis:   66 Text Interpretation: Normal sinus rhythm Nonspecific ST abnormality Abnormal ECG No previous ECGs available no prior ECG for comparison. No STEMI Confirmed by Theda Belfast ([id number]) on 11/03/2022 4:35:52 PM  Radiology CT Head Wo Contrast  Result Date:  11/03/2022 CLINICAL DATA:  Headaches EXAM: CT HEAD WITHOUT CONTRAST TECHNIQUE: Contiguous axial images were obtained from the base of the skull through the vertex without intravenous contrast. RADIATION DOSE REDUCTION: This exam was performed according to the departmental dose-optimization program which includes automated exposure control, adjustment of the mA and/or kV according to patient size and/or use of iterative reconstruction technique. COMPARISON:  None Available. FINDINGS: Brain: No acute intracranial findings are seen. There are no signs of bleeding within the cranium. There is no focal mass effect. Ventricles are not dilated. Vascular: Unremarkable. Skull: No fracture is seen in  calvarium. Sinuses/Orbits: Unremarkable. Other: None. IMPRESSION: No acute intracranial findings are seen in noncontrast CT brain. Electronically Signed   By: Ernie Avena M.D.   On: 11/03/2022 18:19    Procedures Procedures    Medications Ordered in ED Medications  amLODipine (NORVASC) tablet 10 mg (10 mg Oral Given 11/03/22 1726)  oxyCODONE-acetaminophen (PERCOCET/ROXICET) 5-325 MG per tablet 1 tablet (1 tablet Oral Given 11/03/22 1726)  hydrALAZINE (APRESOLINE) injection 10 mg (10 mg Intravenous Given 11/03/22 1903)  sodium chloride 0.9 % bolus 500 mL (500 mLs Intravenous New Bag/Given 11/03/22 1903)    ED Course/ Medical Decision Making/ A&P                           Medical Decision Making Amount and/or Complexity of Data Reviewed Labs: ordered. Radiology: ordered.  Risk Prescription drug management.   41 y.o. female presents to the ED for concern of Dental Pain   This involves an extensive number of treatment options, and is a complaint that carries with it a high risk of complications and morbidity.  The emergent differential diagnosis prior to evaluation includes, but is not limited to: Dental caries, dental abscess, dental fracture, Ludwig's angina, primary HTN, HTN urgency, HTN  emergency  This is not an exhaustive differential.   Past Medical History / Co-morbidities / Social History: Hx of poorly controlled HTN Social Determinants of Health include: No PCP, resources provided  Additional History:  None  Lab Tests: I ordered, and personally interpreted labs.  The pertinent results include:   Mild anemia of Hgb 9.6 HCT 31.6, WBC 10.9 UA without evidence of UTI, hematuria, or proteinuria BMP without significant electrolyte derangement or AKI  Imaging Studies: I ordered imaging studies including CT head.   I independently visualized and interpreted imaging which showed negative for acute intracranial abnormality I agree with the radiologist interpretation.  ED Course: Pt overall well though uncomfortable appearing on exam. Patient presenting with toothache and hypertension.  Nonseptic, non-ill appearing, in NAD.  Afebrile.  Areas of poor dentition appreciated.  Tooth of complaint lower molar on left mandible.  No visible purulent discharge or gross abscess.  Tongue not swollen or elevated.  Neck without swelling or significant tenderness, is very supple on exam.  Mild tenderness of affected area.  Exam unconcerning for Ludwig's angina or spread of infection.  Exam provides low suspicion for sialolithiasis or sialoadenitis.  Airway intact.  Handling secretions without difficulty.  Passed PO challenge.  Mildly reduced TMJ ROM due to elicited pain.  Also with pain radiating into ear per pt.  ENT exam overall unremarkable.  No evidence of otitis media, otitis externa, or mastoiditis.  Same sided headache without dizziness or vision changes.  Without shortness of breath or chest pain.  Without abnormal lung sounds.  EKG not suspicious for myocardial infarction.  Due to hypertension of 220/110 on average since arrival, lack of BP meds over the last month, consumption of 5-6 tabs of 75mg  diclofenac daily over the last 8-9 days, plan to further evaluate with CT head.  Clinical  presentation not strongly suggestive of PRES syndrome at this time.  Amlodipine PO provided. Minimal improvement with amlodipine PO, though with some notable pain improvement with percocet.  CT head negative though with persistent HTN.  Ordered IV hydralazine, plan to reassess. BP has gradually improved to 188/121.    Disposition: 1915 care of Kallista Pae transferred to [employer] at the end of my shift as  the patient will require reassessment once labs/imaging have resulted.  Patient presentation, ED course, and plan of care discussed with review of all pertinent labs and imaging.  Please see his/her note for further details regarding further ED course and disposition.   Plan at time of handoff is dependent on response to BP interventions.  Recently provided IV hydralazine.  If no still with headache and significant HTN, may consider admission for HTN urgency.  However if improvement, continue with outpatient PCP follow up.  Resources for PCP and refill for amlodipine provided.  With regards to dental caries and tooth fracture, plan to treat with Augmentin, anti-inflammatories, and short course of pain medicine.  This may be altered or completely changed at the discretion of the oncoming team pending results of further workup.  I discussed this case with my attending physician Dr. Rush Landmarkegeler, who agreed with the proposed treatment course and cosigned this note.  Attending physician stated agreement with plan or made changes to plan which were implemented.    This chart was dictated using voice recognition software.  Despite best efforts to proofread, errors can occur which can change the documentation meaning.         Final Clinical Impression(s) / ED Diagnoses Final diagnoses:  Pain, dental  Primary hypertension    Rx / DC Orders ED Discharge Orders          Ordered    amLODipine (NORVASC) 10 MG tablet  Daily        11/03/22 1915    HYDROcodone-acetaminophen  (NORCO/VICODIN) 5-325 MG tablet  Every 6 hours PRN        11/03/22 1917    amoxicillin-clavulanate (AUGMENTIN) 875-125 MG tablet  Every 12 hours        11/03/22 1917              Cecil CobbsCockerham, Shriley Joffe M, Cordelia Poche-C 11/03/22 1938    Tegeler, Canary Brimhristopher J, MD 11/03/22 [phone]
